# Patient Record
Sex: Male | Born: 1985
Health system: Southern US, Community
[De-identification: ages and names within clinical notes are randomized; demographics above are authoritative.]

## PROBLEM LIST (undated history)

## (undated) DIAGNOSIS — T7840XA Allergy, unspecified, initial encounter: Secondary | ICD-10-CM

## (undated) DIAGNOSIS — J45909 Unspecified asthma, uncomplicated: Secondary | ICD-10-CM

## (undated) HISTORY — DX: Allergy, unspecified, initial encounter: T78.40XA

## (undated) HISTORY — DX: Unspecified asthma, uncomplicated: J45.909

## (undated) HISTORY — PX: EYE SURGERY: SHX253

---

## 2017-02-25 ENCOUNTER — Emergency Department (INDEPENDENT_AMBULATORY_CARE_PROVIDER_SITE_OTHER)
Admission: EM | Admit: 2017-02-25 | Discharge: 2017-02-25 | Disposition: A | Discharge status: Home / Self Care | Payer: 59 | Source: Home / Self Care | Attending: Family Medicine | Admitting: Family Medicine

## 2017-02-25 ENCOUNTER — Encounter: Payer: Self-pay | Admitting: *Deleted

## 2017-02-25 DIAGNOSIS — T63441A Toxic effect of venom of bees, accidental (unintentional), initial encounter: Secondary | ICD-10-CM

## 2017-02-25 MED ORDER — ONDANSETRON HCL 4 MG PO TABS: 4.0000 mg | ORAL_TABLET | Freq: Four times a day (QID) | ORAL | 0 refills | Status: DC

## 2017-02-25 MED ORDER — FAMOTIDINE 40 MG PO TABS
40.0000 mg | ORAL_TABLET | Freq: Once | ORAL | Status: AC
  Administered 2017-02-25: 40 mg via ORAL

## 2017-02-25 MED ORDER — METHYLPREDNISOLONE SODIUM SUCC 125 MG IJ SOLR
125.0000 mg | Freq: Once | INTRAMUSCULAR | Status: AC
  Administered 2017-02-25: 125 mg via INTRAMUSCULAR

## 2017-02-25 MED ORDER — CETIRIZINE HCL 10 MG PO TABS: 10.0000 mg | ORAL_TABLET | Freq: Every day | ORAL | 1 refills | Status: DC

## 2017-02-25 MED ORDER — PREDNISONE 20 MG PO TABS: ORAL_TABLET | ORAL | 0 refills | Status: DC

## 2017-02-25 MED ORDER — ONDANSETRON 4 MG PO TBDP
4.0000 mg | ORAL_TABLET | Freq: Once | ORAL | Status: AC
  Administered 2017-02-25: 4 mg via ORAL

## 2017-02-25 NOTE — ED Provider Notes (Signed)
[facility]    CSN: [id number] Arrival date & time: 02/25/17  1718     History   Chief Complaint Chief Complaint  Patient presents with  . Insect Bite    HPI Samuel Spencer is a 31 y.o. male.   HPI  Sanad Fearnow is a 31 y.o. male presenting to UC with c/o multiple bee stings to Right hand and arm that occurred last night around 10PM.  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  He took benadryl  at 1300 but has had worsening pain and swelling in Right hand and generalized weakness with mild SOB.   He has been stung by bees before w/o significant reaction but he has never been stung this many times.  Denies oral swelling.    History reviewed. No pertinent past medical history.  There are no active problems to display for this patient.   History reviewed. No pertinent surgical history.     Home Medications    Prior to Admission medications   Medication Sig Start Date End Date Taking? Authorizing Provider  cetirizine (ZYRTEC) 10 MG tablet Take 1 tablet (10 mg total) by mouth daily. 02/25/17   Lurene Shadow, PA-C  ondansetron (ZOFRAN) 4 MG tablet Take 1 tablet (4 mg total) by mouth every 6 (six) hours. 02/25/17   Lurene Shadow, PA-C  predniSONE (DELTASONE) 20 MG tablet 3 tabs po day one, then 2 po daily x 4 days 02/25/17   Lurene Shadow, PA-C    Family History History reviewed. No pertinent family history.  Social History Social History  Substance Use Topics  . Smoking status: Never Smoker  . Smokeless tobacco: Never Used  . Alcohol use Yes     Allergies   Latex   Review of Systems Review of Systems  Respiratory: Positive for shortness of breath (mild). Negative for chest tightness, wheezing and stridor.   Cardiovascular: Negative for chest pain and palpitations.  Musculoskeletal: Positive for arthralgias, joint  swelling and myalgias.       Right hand  Skin: Positive for color change and rash.     Physical Exam Triage Vital Signs ED Triage Vitals [02/25/17 1744]  Enc Vitals Group     BP 123/80     Pulse Rate 77     Resp 16     Temp 98.9 F (37.2 C)     Temp Source Oral     SpO2 100 %     Weight 196 lb (88.9 kg)     Height  (1.88 m)     Head Circumference      Peak Flow      Pain Score      Pain Loc      Pain Edu?      Excl. in GC?    No data found.   Updated Vital Signs BP 123/80 (BP Location: Left Arm)   Pulse 77   Temp 98.9 F (37.2 C) (Oral)   Resp 16   Ht  (1.88 m)   Wt 196 lb (88.9 kg)   SpO2 100%   BMI 25.16 kg/m   Visual Acuity Right Eye Distance:   Left Eye Distance:   Bilateral Distance:    Right Eye Near:   Left Eye Near:    Bilateral Near:     Physical Exam  Constitutional: He is oriented to person, place, and time. He appears well-developed and well-nourished. No distress.  HENT:  Head: Normocephalic and atraumatic.  Mouth/Throat: Oropharynx is clear and moist.  No oral swelling  Eyes: EOM are normal.  Neck: Normal range of motion.  Cardiovascular: Normal rate.   Pulses:      Radial pulses are 2+ on the right side, and 2+ on the left side.  Pulmonary/Chest: Effort normal. No stridor.  Musculoskeletal: Normal range of motion. He exhibits edema and tenderness.  Right hand and wrist: moderate edema, tender. Slight decreased ROM due to swelling. Right forearm: mild edema to proximal aspect, mildly tender  Neurological: He is alert and oriented to person, place, and time.  Skin: Skin is warm and dry. Capillary refill takes less than 2 seconds. He is not diaphoretic. There is erythema.  Right hand, wrist, and forearm: diffuse erythema with slight warmth of skin.  Mildly tender. No induration or fluctuance. No bleeding or drainage. Rash does blanch.  Psychiatric: He has a normal mood and affect. His behavior is normal.  Nursing note and  vitals reviewed.    UC Treatments / Results  Labs (all labs ordered are listed, but only abnormal results are displayed) Labs Reviewed - No data to display  EKG  EKG Interpretation None       Radiology No results found.  Procedures Procedures (including critical care time)  Medications Ordered in UC Medications  famotidine (PEPCID) tablet 40 mg (40 mg Oral Given 02/25/17 1748)  methylPREDNISolone sodium succinate (SOLU-MEDROL) 125 mg/2 mL injection 125 mg (125 mg Intramuscular Given 02/25/17 1750)  ondansetron (ZOFRAN-ODT) disintegrating tablet 4 mg (4 mg Oral Given 02/25/17 1753)     Initial Impression / Assessment and Plan / UC Course  I have reviewed the triage vital signs and the nursing notes.  Pertinent labs & imaging results that were available during my care of the patient were reviewed by me and considered in my medical decision making (see chart for details).     Hx and exam c/w delayed systemic reaction to multiple bee stings.  No oral swelling or signs of distress at this time.  Pepcid and Solumedrol given in UC. Pt c/o mild nausea, zofran also given  Discharged home in good condition. Encouraged to start oral prednisone tomorrow along with cetirizine F/u with PCP as needed or go to closest ED if symptoms worsening.   Final Clinical Impressions(s) / UC Diagnoses   Final diagnoses:  Allergic reaction to bee sting    New Prescriptions Discharge Medication List as of 02/25/2017  6:01 PM    START taking these medications   Details  cetirizine (ZYRTEC) 10 MG tablet Take 1 tablet (10 mg total) by mouth daily., Starting Mon 02/25/2017, Print    ondansetron (ZOFRAN) 4 MG tablet Take 1 tablet (4 mg total) by mouth every 6 (six) hours., Starting Mon 02/25/2017, Print    predniSONE (DELTASONE) 20 MG tablet 3 tabs po day one, then 2 po daily x 4 days, Print         Controlled Substance Prescriptions [place] Controlled Substance Registry consulted? Not  Applicable   Rolla Plate 02/26/17 1421

## 2017-02-25 NOTE — ED Triage Notes (Signed)
Pt c/o RT hand swelling, weakness and SOB post bee stings last night at 10pm. He reports approximately 15 stings with 10 of them on his hand. He took Benadryl  at 1300.

## 2017-03-08 ENCOUNTER — Encounter: Payer: Self-pay | Admitting: Family Medicine

## 2017-03-08 ENCOUNTER — Ambulatory Visit (INDEPENDENT_AMBULATORY_CARE_PROVIDER_SITE_OTHER): Payer: 59 | Admitting: Family Medicine

## 2017-03-08 VITALS — BP 126/83 | HR 65 | Temp 97.8°F | Ht 74.0 in | Wt 199.2 lb

## 2017-03-08 DIAGNOSIS — M545 Low back pain, unspecified: Secondary | ICD-10-CM

## 2017-03-08 NOTE — Progress Notes (Signed)
Pre visit review using our clinic tool,if applicable. No additional management support is needed unless otherwise documented below in the visit note.  

## 2017-03-08 NOTE — Patient Instructions (Signed)
Heat (pad or rice pillow in microwave) over affected area, 10-15 minutes every 2-3 hours while awake.   Ibuprofen 400-600 mg (2-3 over the counter strength tabs) every 6 hours as needed for pain.  OK to take Tylenol 1000 mg (2 extra strength tabs) or 975 mg (3 regular strength tabs) every 6 hours as needed.  EXERCISES  RANGE OF MOTION (ROM) AND STRETCHING EXERCISES - Low Back Sprain Most people with lower back pain will find that their symptoms get worse with excessive bending forward (flexion) or arching at the lower back (extension). The exercises that will help resolve your symptoms will focus on the opposite motion.  Your physician, physical therapist or athletic trainer will help you determine which exercises will be most helpful to resolve your lower back pain. Do not complete any exercises without first consulting with your caregiver. Discontinue any exercises which make your symptoms worse, until you speak to your caregiver. If you have pain, numbness or tingling which travels down into your buttocks, leg or foot, the goal of the therapy is for these symptoms to move closer to your back and eventually resolve. Sometimes, these leg symptoms will get better, but your lower back pain may worsen. This is often an indication of progress in your rehabilitation. Be very alert to any changes in your symptoms and the activities in which you participated in the 24 hours prior to the change. Sharing this information with your caregiver will allow him or her to most efficiently treat your condition. These exercises may help you when beginning to rehabilitate your injury. Your symptoms may resolve with or without further involvement from your physician, physical therapist or athletic trainer. While completing these exercises, remember:   Restoring tissue flexibility helps normal motion to return to the joints. This allows healthier, less painful movement and activity.  An effective stretch should be held  for at least 30 seconds.  A stretch should never be painful. You should only feel a gentle lengthening or release in the stretched tissue. FLEXION RANGE OF MOTION AND STRETCHING EXERCISES:  STRETCH - Flexion, Single Knee to Chest   Lie on a firm bed or floor with both legs extended in front of you.  Keeping one leg in contact with the floor, bring your opposite knee to your chest. Hold your leg in place by either grabbing behind your thigh or at your knee.  Pull until you feel a gentle stretch in your low back. Hold 15-20 seconds.  Slowly release your grasp and repeat the exercise with the opposite side. Repeat 2 times. Complete this exercise 1-2 times per day.   STRETCH - Flexion, Double Knee to Chest  Lie on a firm bed or floor with both legs extended in front of you.  Keeping one leg in contact with the floor, bring your opposite knee to your chest.  Tense your stomach muscles to support your back and then lift your other knee to your chest. Hold your legs in place by either grabbing behind your thighs or at your knees.  Pull both knees toward your chest until you feel a gentle stretch in your low back. Hold 15-20 seconds.  Tense your stomach muscles and slowly return one leg at a time to the floor. Repeat 2 times. Complete this exercise 1-2 times per day.   STRETCH - Low Trunk Rotation  Lie on a firm bed or floor. Keeping your legs in front of you, bend your knees so they are both pointed toward the ceiling  and your feet are flat on the floor.  Extend your arms out to the side. This will stabilize your upper body by keeping your shoulders in contact with the floor.  Gently and slowly drop both knees together to one side until you feel a gentle stretch in your low back. Hold for 15-20 seconds.  Tense your stomach muscles to support your lower back as you bring your knees back to the starting position. Repeat the exercise to the other side. Repeat 2 times. Complete this  exercise 1-2 times per day  EXTENSION RANGE OF MOTION AND FLEXIBILITY EXERCISES:  STRETCH - Extension, Prone on Elbows   Lie on your stomach on the floor, a bed will be too soft. Place your palms about shoulder width apart and at the height of your head.  Place your elbows under your shoulders. If this is too painful, stack pillows under your chest.  Allow your body to relax so that your hips drop lower and make contact more completely with the floor.  Hold this position for 15-20 seconds.  Slowly return to lying flat on the floor. Repeat 2 times. Complete this exercise 1-2 times per day.   RANGE OF MOTION - Extension, Prone Press Ups  Lie on your stomach on the floor, a bed will be too soft. Place your palms about shoulder width apart and at the height of your head.  Keeping your back as relaxed as possible, slowly straighten your elbows while keeping your hips on the floor. You may adjust the placement of your hands to maximize your comfort. As you gain motion, your hands will come more underneath your shoulders.  Hold this position 15-20 seconds.  Slowly return to lying flat on the floor. Repeat 2 times. Complete this exercise 1-2 times per day.   RANGE OF MOTION- Quadruped, Neutral Spine   Assume a hands and knees position on a firm surface. Keep your hands under your shoulders and your knees under your hips. You may place padding under your knees for comfort.  Drop your head and point your tailbone toward the ground below you. This will round out your lower back like an angry cat. Hold this position for 15-20 seconds.  Slowly lift your head and release your tail bone so that your back sags into a large arch, like an old horse.  Hold this position for 15-20 seconds.  Repeat this until you feel limber in your low back.  Now, find your "sweet spot." This will be the most comfortable position somewhere between the two previous positions. This is your neutral spine. Once you  have found this position, tense your stomach muscles to support your low back.  Hold this position for 15-20 seconds. Repeat 2 times. Complete this exercise 1-2 times per day.  STRENGTHENING EXERCISES - Low Back Sprain These exercises may help you when beginning to rehabilitate your injury. These exercises should be done near your "sweet spot." This is the neutral, low-back arch, somewhere between fully rounded and fully arched, that is your least painful position. When performed in this safe range of motion, these exercises can be used for people who have either a flexion or extension based injury. These exercises may resolve your symptoms with or without further involvement from your physician, physical therapist or athletic trainer. While completing these exercises, remember:   Muscles can gain both the endurance and the strength needed for everyday activities through controlled exercises.  Complete these exercises as instructed by your physician, physical therapist or  Event organiser. Increase the resistance and repetitions only as guided.  You may experience muscle soreness or fatigue, but the pain or discomfort you are trying to eliminate should never worsen during these exercises. If this pain does worsen, stop and make certain you are following the directions exactly. If the pain is still present after adjustments, discontinue the exercise until you can discuss the trouble with your caregiver.  STRENGTHENING - Deep Abdominals, Pelvic Tilt   Lie on a firm bed or floor. Keeping your legs in front of you, bend your knees so they are both pointed toward the ceiling and your feet are flat on the floor.  Tense your lower abdominal muscles to press your low back into the floor. This motion will rotate your pelvis so that your tail bone is scooping upwards rather than pointing at your feet or into the floor. With a gentle tension and even breathing, hold this position for 10-15 seconds. Repeat 2  times. Complete this exercise 1 time per day.   STRENGTHENING - Abdominals, Crunches   Lie on a firm bed or floor. Keeping your legs in front of you, bend your knees so they are both pointed toward the ceiling and your feet are flat on the floor. Cross your arms over your chest.  Slightly tip your chin down without bending your neck.  Tense your abdominals and slowly lift your trunk high enough to just clear your shoulder blades. Lifting higher can put excessive stress on the lower back and does not further strengthen your abdominal muscles.  Control your return to the starting position. Repeat 2 times. Complete this exercise once every 1-2 days.   STRENGTHENING - Quadruped, Opposite UE/LE Lift   Assume a hands and knees position on a firm surface. Keep your hands under your shoulders and your knees under your hips. You may place padding under your knees for comfort.  Find your neutral spine and gently tense your abdominal muscles so that you can maintain this position. Your shoulders and hips should form a rectangle that is parallel with the floor and is not twisted.  Keeping your trunk steady, lift your right hand no higher than your shoulder and then your left leg no higher than your hip. Make sure you are not holding your breath. Hold this position for 15-20 seconds.  Continuing to keep your abdominal muscles tense and your back steady, slowly return to your starting position. Repeat with the opposite arm and leg. Repeat 2 times. Complete this exercise once every 1-2 days.   STRENGTHENING - Abdominals and Quadriceps, Straight Leg Raise   Lie on a firm bed or floor with both legs extended in front of you.  Keeping one leg in contact with the floor, bend the other knee so that your foot can rest flat on the floor.  Find your neutral spine, and tense your abdominal muscles to maintain your spinal position throughout the exercise.  Slowly lift your straight leg off the floor about 6  inches for a count of 15, making sure to not hold your breath.  Still keeping your neutral spine, slowly lower your leg all the way to the floor. Repeat this exercise with each leg 2 times. Complete this exercise once every 1-2 days. POSTURE AND BODY MECHANICS CONSIDERATIONS - Low Back Sprain Keeping correct posture when sitting, standing or completing your activities will reduce the stress put on different body tissues, allowing injured tissues a chance to heal and limiting painful experiences. The following are general  guidelines for improved posture. Your physician or physical therapist will provide you with any instructions specific to your needs. While reading these guidelines, remember:  The exercises prescribed by your provider will help you have the flexibility and strength to maintain correct postures.  The correct posture provides the best environment for your joints to work. All of your joints have less wear and tear when properly supported by a spine with good posture. This means you will experience a healthier, less painful body.  Correct posture must be practiced with all of your activities, especially prolonged sitting and standing. Correct posture is as important when doing repetitive low-stress activities (typing) as it is when doing a single heavy-load activity (lifting).  RESTING POSITIONS Consider which positions are most painful for you when choosing a resting position. If you have pain with flexion-based activities (sitting, bending, stooping, squatting), choose a position that allows you to rest in a less flexed posture. You would want to avoid curling into a fetal position on your side. If your pain worsens with extension-based activities (prolonged standing, working overhead), avoid resting in an extended position such as sleeping on your stomach. Most people will find more comfort when they rest with their spine in a more neutral position, neither too rounded nor too arched.  Lying on a non-sagging bed on your side with a pillow between your knees, or on your back with a pillow under your knees will often provide some relief. Keep in mind, being in any one position for a prolonged period of time, no matter how correct your posture, can still lead to stiffness. PROPER SITTING POSTURE In order to minimize stress and discomfort on your spine, you must sit with correct posture. Sitting with good posture should be effortless for a healthy body. Returning to good posture is a gradual process. Many people can work toward this most comfortably by using various supports until they have the flexibility and strength to maintain this posture on their own. When sitting with proper posture, your ears will fall over your shoulders and your shoulders will fall over your hips. You should use the back of the chair to support your upper back. Your lower back will be in a neutral position, just slightly arched. You may place a small pillow or folded towel at the base of your lower back for  support.  When working at a desk, create an environment that supports good, upright posture. Without extra support, muscles tire, which leads to excessive strain on joints and other tissues. Keep these recommendations in mind:  CHAIR:  A chair should be able to slide under your desk when your back makes contact with the back of the chair. This allows you to work closely.  The chair's height should allow your eyes to be level with the upper part of your monitor and your hands to be slightly lower than your elbows.  BODY POSITION  Your feet should make contact with the floor. If this is not possible, use a foot rest.  Keep your ears over your shoulders. This will reduce stress on your neck and low back.  INCORRECT SITTING POSTURES  If you are feeling tired and unable to assume a healthy sitting posture, do not slouch or slump. This puts excessive strain on your back tissues, causing more damage and  pain. Healthier options include:  Using more support, like a lumbar pillow.  Switching tasks to something that requires you to be upright or walking.  Talking a brief walk.  Lying down to rest in a neutral-spine position.  PROLONGED STANDING WHILE SLIGHTLY LEANING FORWARD  When completing a task that requires you to lean forward while standing in one place for a long time, place either foot up on a stationary 2-4 inch high object to help maintain the best posture. When both feet are on the ground, the lower back tends to lose its slight inward curve. If this curve flattens (or becomes too large), then the back and your other joints will experience too much stress, tire more quickly, and can cause pain.  CORRECT STANDING POSTURES Proper standing posture should be assumed with all daily activities, even if they only take a few moments, like when brushing your teeth. As in sitting, your ears should fall over your shoulders and your shoulders should fall over your hips. You should keep a slight tension in your abdominal muscles to brace your spine. Your tailbone should point down to the ground, not behind your body, resulting in an over-extended swayback posture.   INCORRECT STANDING POSTURES  Common incorrect standing postures include a forward head, locked knees and/or an excessive swayback. WALKING Walk with an upright posture. Your ears, shoulders and hips should all line-up.  PROLONGED ACTIVITY IN A FLEXED POSITION When completing a task that requires you to bend forward at your waist or lean over a low surface, try to find a way to stabilize 3 out of 4 of your limbs. You can place a hand or elbow on your thigh or rest a knee on the surface you are reaching across. This will provide you more stability, so that your muscles do not tire as quickly. By keeping your knees relaxed, or slightly bent, you will also reduce stress across your lower back. CORRECT LIFTING TECHNIQUES  DO :  Assume a  wide stance. This will provide you more stability and the opportunity to get as close as possible to the object which you are lifting.  Tense your abdominals to brace your spine. Bend at the knees and hips. Keeping your back locked in a neutral-spine position, lift using your leg muscles. Lift with your legs, keeping your back straight.  Test the weight of unknown objects before attempting to lift them.  Try to keep your elbows locked down at your sides in order get the best strength from your shoulders when carrying an object.     Always ask for help when lifting heavy or awkward objects. INCORRECT LIFTING TECHNIQUES DO NOT:   Lock your knees when lifting, even if it is a small object.  Bend and twist. Pivot at your feet or move your feet when needing to change directions.  Assume that you can safely pick up even a paperclip without proper posture.

## 2017-03-08 NOTE — Progress Notes (Signed)
Chief Complaint  Patient presents with  . Establish Care       New Patient Visit SUBJECTIVE: HPI: Samuel Spencer is an 31 y.o.male who is being seen for establishing care.  The patient has not had PCP in many years.   Over the past 1 month, the patient is been having left-sided lower back pain. It comes and goes. No injury or change in activity. He describes as more of an ache. No radiation. He is concerned he may have a kidney stone. He has no personal or family history of kidney stones. He does not have history of gout. He maintains his oral intake of water is good. The patient denies any numbness, tingling, weakness, blood in the urine, nausea, vomiting, or incontinence. The patient has been using ibuprofen with relief. He does not routinely stretch.  Allergies  Allergen Reactions  . Latex     Past Medical History:  Diagnosis Date  . Allergy   . Asthma    Past Surgical History:  Procedure Laterality Date  . EYE SURGERY     Social History   Social History  . Marital status: Married   Social History Main Topics  . Smoking status: Never Smoker  . Smokeless tobacco: Never Used  . Alcohol use Yes  . Drug use: No   Family History  Problem Relation Age of Onset  . Arthritis Mother   . Drug abuse Father   . Alcohol abuse Father   . Cancer Neg Hx    Takes no medications routinely.   ROS MSK: +back pain  Neuro: Denies numbness/tingling   OBJECTIVE: BP 126/83   Pulse 65   Temp 97.8 F (36.6 C) (Oral)   Ht 6\' 2"  (1.88 m)   Wt 199 lb 3.2 oz (90.4 kg)   SpO2 100%   BMI 25.58 kg/m   Constitutional: -  VS reviewed -  Well developed, well nourished, appears stated age -  No apparent distress  Psychiatric: -  Oriented to person, place, and time -  Memory intact -  Affect and mood normal -  Fluent conversation, good eye contact -  Judgment and insight age appropriate  Eye: -  Conjunctivae clear, no discharge -  Pupils symmetric, round, reactive to  light  Cardiovascular: -  RRR -  No LE edema  Respiratory: -  Normal respiratory effort, no accessory muscle use, no retraction -  Breath sounds equal, no wheezes, no ronchi, no crackles  Gastrointestinal: -  Bowel sounds normal -  No tenderness, no distention, no guarding, no masses  Neurological:  -  CN II - XII grossly intact -  Sensation grossly intact to light touch, equal bilaterally -  DTR's equal and symmetric in LE's b/l  Musculoskeletal: -  No clubbing, no cyanosis -  Gait normal -  +TTP over L erector spinae msc -   Neg Lloyd's sign  Skin: -  No significant lesion on inspection -  Warm and dry to palpation   ASSESSMENT/PLAN: Acute left-sided low back pain without sciatica  NSAIDs, stretches/exercises, Tylenol, heat. No concerning findings for kidney stone. Patient should return in 1 mo for CPE, fasting or prn. The patient voiced understanding and agreement to the plan.   Jilda Rocheicholas Paul Jacksboro[place]end[place], DO 03/08/17  9:03 AM

## 2017-09-26 ENCOUNTER — Ambulatory Visit (INDEPENDENT_AMBULATORY_CARE_PROVIDER_SITE_OTHER): Payer: Self-pay | Admitting: Nurse Practitioner

## 2017-09-26 DIAGNOSIS — Z111 Encounter for screening for respiratory tuberculosis: Secondary | ICD-10-CM

## 2017-09-26 NOTE — Progress Notes (Signed)
Patient presents for PPD placement for School denies previous positive TB test  Denies known exposure to TB   Tuberculin skin test applied to left ventral forearm.  Patient informed to return to [facility] in 48-72 hours for PPD read. Vaccine Information Statement provided to patient.

## 2017-09-28 NOTE — Progress Notes (Signed)
Came in for PPD reading today. Read by me as negative-0 mm  negative

## 2017-09-30 ENCOUNTER — Telehealth: Payer: Self-pay

## 2017-09-30 NOTE — Telephone Encounter (Signed)
Mailbox of the patient is full and cannot accept any message. 

## 2017-10-01 ENCOUNTER — Ambulatory Visit (INDEPENDENT_AMBULATORY_CARE_PROVIDER_SITE_OTHER): Payer: No Typology Code available for payment source | Admitting: Family Medicine

## 2017-10-01 ENCOUNTER — Encounter: Payer: Self-pay | Admitting: Family Medicine

## 2017-10-01 VITALS — BP 118/80 | HR 73 | Temp 98.2°F | Ht 74.0 in | Wt 199.1 lb

## 2017-10-01 DIAGNOSIS — Z0000 Encounter for general adult medical examination without abnormal findings: Secondary | ICD-10-CM

## 2017-10-01 DIAGNOSIS — Z114 Encounter for screening for human immunodeficiency virus [HIV]: Secondary | ICD-10-CM

## 2017-10-01 LAB — COMPREHENSIVE METABOLIC PANEL
ALT: 24 U/L (ref 0–53)
AST: 16 U/L (ref 0–37)
Albumin: 4.5 g/dL (ref 3.5–5.2)
Alkaline Phosphatase: 65 U/L (ref 39–117)
BUN: 10 mg/dL (ref 6–23)
CALCIUM: 9.5 mg/dL (ref 8.4–10.5)
CHLORIDE: 104 meq/L (ref 96–112)
CO2: 28 meq/L (ref 19–32)
CREATININE: 0.92 mg/dL (ref 0.40–1.50)
GFR: 100.49 mL/min (ref >60.00)
Glucose, Bld: 99 mg/dL (ref 70–99)
Potassium: 4.2 mEq/L (ref 3.5–5.1)
SODIUM: 139 meq/L (ref 135–145)
Total Bilirubin: 0.9 mg/dL (ref 0.2–1.2)
Total Protein: 7.5 g/dL (ref 6.0–8.3)

## 2017-10-01 LAB — LIPID PANEL
CHOL/HDL RATIO: 4
Cholesterol: 144 mg/dL (ref 0–200)
HDL: 40.8 mg/dL (ref >39.00)
LDL CALC: 88 mg/dL (ref 0–99)
NonHDL: 103.36
TRIGLYCERIDES: 78 mg/dL (ref 0.0–149.0)
VLDL: 15.6 mg/dL (ref 0.0–40.0)

## 2017-10-01 NOTE — Patient Instructions (Signed)
Keep up the good work.  Let me know over MyChart if you're interested in seeing a urologist for vasectomy.  Let us know if you need anything.

## 2017-10-01 NOTE — Progress Notes (Signed)
Chief Complaint  Patient presents with  . Annual Exam    Well Male Samuel Spencer is here for a complete physical.   His last physical was >1 year ago.  Current diet: in general, a "healthy" diet.   Current exercise: Chases small child around Weight trend: stable Does pt snore? No. Daytime fatigue? Yes Seat belt? Yes.    Health maintenance Tetanus- Yes HIV- No  Past Medical History:  Diagnosis Date  . Allergy   . Asthma    Allergy induced     Past Surgical History:  Procedure Laterality Date  . EYE SURGERY      Medications  Albuterol prn.    Allergies Allergies  Allergen Reactions  . Latex     Family History Family History  Problem Relation Age of Onset  . Arthritis Mother   . Drug abuse Father   . Alcohol abuse Father   . Cancer Neg Hx     Review of Systems: Constitutional: no fevers or chills Eye:  no recent significant change in vision Ear/Nose/Mouth/Throat:  Ears:  no tinnitus or hearing loss Nose/Mouth/Throat:  no complaints of nasal congestion, no sore throat Cardiovascular:  no chest pain, no palpitations Respiratory:  no cough and no shortness of breath Gastrointestinal:  no abdominal pain, no change in bowel habits GU:  Male: negative for dysuria, frequency, and incontinence and negative for prostate symptoms Musculoskeletal/Extremities:  no pain, redness, or swelling of the joints Integumentary (Skin/Breast):  no abnormal skin lesions reported Neurologic:  no headaches, no numbness, tingling Endocrine: No unexpected weight changes Hematologic/Lymphatic:  no night sweats  Exam BP 118/80 (BP Location: Left Arm, Patient Position: Sitting, Cuff Size: Normal)   Pulse 73   Temp 98.2 F (36.8 C) (Oral)   Ht  (1.88 m)   Wt 199 lb 2 oz (90.3 kg)   SpO2 97%   BMI 25.57 kg/m  General:  well developed, well nourished, in no apparent distress Skin:  no significant moles, warts, or growths Head:  no masses, lesions, or  tenderness Eyes:  pupils equal and round, sclera anicteric without injection Ears:  canals without lesions, TMs shiny without retraction, no obvious effusion, no erythema Nose:  nares patent, septum midline, mucosa normal Throat/Pharynx:  lips and gingiva without lesion; tongue and uvula midline; non-inflamed pharynx; no exudates or postnasal drainage Neck: neck supple without adenopathy, thyromegaly, or masses Lungs:  clear to auscultation, breath sounds equal bilaterally, no respiratory distress Cardio:  regular rate and rhythm, no bruits, no LE edema Abdomen:  abdomen soft, nontender; bowel sounds normal; no masses or organomegaly Genital (male): circumcised penis, no lesions or discharge; testes present bilaterally without masses or tenderness Rectal: Deferred Musculoskeletal:  symmetrical muscle groups noted without atrophy or deformity Extremities:  no clubbing, cyanosis, or edema, no deformities, no skin discoloration Neuro:  gait normal; deep tendon reflexes normal and symmetric Psych: well oriented with normal range of affect and appropriate judgment/insight  Assessment and Plan  Well adult exam - Plan: Comprehensive metabolic panel, Lipid panel  Screening for HIV (human immunodeficiency virus) - Plan: HIV antibody   Well 32 y.o. male. Counseled on diet and exercise. He is doing well. Discussed PCV23, as he rarely has issues, will hold off for now.  He is interested in vasectomy at some point, he will let us know when he is ready and we will refer to urology. Other orders as above. Follow up in 1 year pending the above workup. The patient voiced understanding  and agreement to the plan.  Jilda Roche [place], DO 10/01/17 8:23 AM

## 2017-10-01 NOTE — Progress Notes (Signed)
Pre visit review using our clinic review tool, if applicable. No additional management support is needed unless otherwise documented below in the visit note. 

## 2017-10-02 LAB — HIV ANTIBODY (ROUTINE TESTING W REFLEX): HIV 1&2 Ab, 4th Generation: NONREACTIVE

## 2017-11-28 ENCOUNTER — Ambulatory Visit (INDEPENDENT_AMBULATORY_CARE_PROVIDER_SITE_OTHER): Payer: Self-pay | Admitting: Family Medicine

## 2017-11-28 VITALS — BP 110/72 | HR 75 | Temp 98.6°F | Wt 200.8 lb

## 2017-11-28 DIAGNOSIS — L03115 Cellulitis of right lower limb: Secondary | ICD-10-CM

## 2017-11-28 MED ORDER — CEFTRIAXONE SODIUM 500 MG IJ SOLR
500.0000 mg | Freq: Once | INTRAMUSCULAR | Status: AC
  Administered 2017-11-28: 500 mg via INTRAMUSCULAR

## 2017-11-28 MED ORDER — CEPHALEXIN 500 MG PO CAPS: 500.0000 mg | ORAL_CAPSULE | Freq: Three times a day (TID) | ORAL | 0 refills | Status: AC

## 2017-11-28 MED FILL — CEPHALEXIN 500 MG CAPSULE: 500 | 10 days supply | Qty: 30 | Fill #0

## 2017-11-28 NOTE — Progress Notes (Signed)
Patient ID: Samuel Spencer, male    DOB: May 25, 1985, 32 y.o.   MRN: [id number]  PCP: Sharlene Dory, DO  Chief Complaint  Patient presents with  . right foot pain, swollen    Subjective:  HPI Samuel Spencer is a 32 y.o. male presents for evaluation of right ankle swelling after sustaining an abrasion while white water rafting 5 days prior. Over the course of the last 5 days, patient's ankle has increased in swelling, tender to touch, and redden. He has only attempted relief with ibuprofen. Denies fever, chills, red streaks, redness extending beyond the site of wound, or N&V. Social History   Socioeconomic History  . Marital status: Married    Spouse name: Not on file  . Number of children: Not on file  . Years of education: Not on file  . Highest education level: Not on file  Occupational History  . Not on file  Social Needs  . Financial resource strain: Not on file  . Food insecurity:    Worry: Not on file    Inability: Not on file  . Transportation needs:    Medical: Not on file    Non-medical: Not on file  Tobacco Use  . Smoking status: Never Smoker  . Smokeless tobacco: Never Used  Substance and Sexual Activity  . Alcohol use: Yes  . Drug use: No  . Sexual activity: Yes    Partners: Female  Lifestyle  . Physical activity:    Days per week: Not on file    Minutes per session: Not on file  . Stress: Not on file  Relationships  . Social connections:    Talks on phone: Not on file    Gets together: Not on file    Attends religious service: Not on file    Active member of club or organization: Not on file    Attends meetings of clubs or organizations: Not on file    Relationship status: Not on file  . Intimate partner violence:    Fear of current or ex partner: Not on file    Emotionally abused: Not on file    Physically abused: Not on file    Forced sexual activity: Not on file  Other Topics Concern  . Not on file  Social History  Narrative  . Not on file    Family History  Problem Relation Age of Onset  . Arthritis Mother   . Drug abuse Father   . Alcohol abuse Father   . Cancer Neg Hx    Review of Systems Pertinent negatives listed in HPI Patient Active Problem List   Diagnosis Date Noted  . Well adult exam 10/01/2017    Allergies  Allergen Reactions  . Latex     Prior to Admission medications   Medication Sig Start Date End Date Taking? Authorizing Provider  albuterol (VENTOLIN HFA) 108 (90 Base) MCG/ACT inhaler Inhale 1-2 puffs into the lungs every 6 (six) hours as needed for wheezing or shortness of breath. 10/01/17   Wendling, Jilda Roche, DO  cephALEXin (KEFLEX) 500 MG capsule Take 1 capsule (500 mg total) by mouth 3 (three) times daily for 10 days. 11/28/17 12/08/17  Bing Neighbors, FNP    Past Medical, Surgical Family and Social History reviewed and updated.    Objective:   Today's Vitals   11/28/17 1518  BP: 110/72  Pulse: 75  Temp: 98.6 F (37 C)  TempSrc: Oral  SpO2: 97%  Weight: 200 lb 12.8 oz (  91.1 kg)    Wt Readings from Last 3 Encounters:  11/28/17 200 lb 12.8 oz (91.1 kg)  10/01/17 199 lb 2 oz (90.3 kg)  03/08/17 199 lb 3.2 oz (90.4 kg)   Physical Exam  Constitutional: He is oriented to person, place, and time. He appears well-developed and well-nourished.  Cardiovascular: Normal rate, regular rhythm and normal heart sounds.  Pulmonary/Chest: Effort normal and breath sounds normal.  Neurological: He is alert and oriented to person, place, and time.  Skin: Skin is warm. Lesion noted. There is erythema.     Psychiatric: He has a normal mood and affect. His behavior is normal. Judgment and thought content normal.  Vitals reviewed.  Assessment & Plan:  1. Cellulitis of right lower extremity Administered cefTRIAXone (ROCEPHIN) injection 500 mg IM. Treat empirically with Keflex 500 mg TID x 10 days. Educated provided regarding worsening signs of infection. Patient  advised to follow-up in 72 hours if wound and swelling doesn't improve.   Meds ordered this encounter  Medications  . cefTRIAXone (ROCEPHIN) injection 500 mg  . cephALEXin (KEFLEX) 500 MG capsule    Sig: Take 1 capsule (500 mg total) by mouth 3 (three) times daily for 10 days.    Dispense:  30 capsule    Refill:  0   If symptoms worsen or do not improve, return for follow-up, follow-up with PCP, or at the emergency department if severity of symptoms warrant a higher level of care.   Godfrey PickKimberly S. Tiburcio PeaHarris, MSN, FNP-C [facility]  [address]  [place], [place] [id number] [phone]

## 2017-11-28 NOTE — Patient Instructions (Signed)
Take all medication as prescribed. I recommend wearing compression stockings to help with swelling while standing for prolonged periods of time. I recommend applying ice application to help manage swelling.    Cellulitis, Adult Cellulitis is a skin infection. The infected area is usually red and sore. This condition occurs most often in the arms and lower legs. It is very important to get treated for this condition. Follow these instructions at home:  Take over-the-counter and prescription medicines only as told by your doctor.  If you were prescribed an antibiotic medicine, take it as told by your doctor. Do not stop taking the antibiotic even if you start to feel better.  Drink enough fluid to keep your pee (urine) clear or pale yellow.  Do not touch or rub the infected area.  Raise (elevate) the infected area above the level of your heart while you are sitting or lying down.  Place warm or cold wet cloths (warm or cold compresses) on the infected area. Do this as told by your doctor.  Keep all follow-up visits as told by your doctor. This is important. These visits let your doctor make sure your infection is not getting worse. Contact a doctor if:  You have a fever.  Your symptoms do not get better after 1-2 days of treatment.  Your bone or joint under the infected area starts to hurt after the skin has healed.  Your infection comes back. This can happen in the same area or another area.  You have a swollen bump in the infected area.  You have new symptoms.  You feel ill and also have muscle aches and pains. Get help right away if:  Your symptoms get worse.  You feel very sleepy.  You throw up (vomit) or have watery poop (diarrhea) for a long time.  There are red streaks coming from the infected area.  Your red area gets larger.  Your red area turns darker. This information is not intended to replace advice given to you by your health care provider. Make sure you  discuss any questions you have with your health care provider. Document Released: 10/24/2007 Document Revised: 10/13/2015 Document Reviewed: 03/16/2015 Elsevier Interactive Patient Education  2018 [employer]

## 2017-12-12 ENCOUNTER — Ambulatory Visit (INDEPENDENT_AMBULATORY_CARE_PROVIDER_SITE_OTHER): Payer: Self-pay | Admitting: Family Medicine

## 2017-12-12 VITALS — BP 115/75 | HR 61 | Temp 97.6°F | Resp 97 | Wt 205.0 lb

## 2017-12-12 DIAGNOSIS — S139XXA Sprain of joints and ligaments of unspecified parts of neck, initial encounter: Secondary | ICD-10-CM

## 2017-12-12 MED ORDER — CYCLOBENZAPRINE HCL 10 MG PO TABS: 10.0000 mg | ORAL_TABLET | Freq: Three times a day (TID) | ORAL | 0 refills | Status: DC | PRN

## 2017-12-12 MED ORDER — MELOXICAM 15 MG PO TABS: 15.0000 mg | ORAL_TABLET | Freq: Every day | ORAL | 0 refills | Status: DC

## 2017-12-12 MED FILL — CYCLOBENZAPRINE HCL 10 MG T: 10 | 5 days supply | Qty: 15 | Fill #0

## 2017-12-12 MED FILL — MELOXICAM 15 MG TABLET: 15 | 7 days supply | Qty: 7 | Fill #0

## 2017-12-12 NOTE — Patient Instructions (Signed)
Cervical Strain and Sprain Rehab Ask your health care provider which exercises are safe for you. Do exercises exactly as told by your health care provider and adjust them as directed. It is normal to feel mild stretching, pulling, tightness, or discomfort as you do these exercises, but you should stop right away if you feel sudden pain or your pain gets worse.Do not begin these exercises until told by your health care provider. Stretching and range of motion exercises These exercises warm up your muscles and joints and improve the movement and flexibility of your neck. These exercises also help to relieve pain, numbness, and tingling. Exercise A: Cervical side bend  1. Using good posture, sit on a stable chair or stand up. 2. Without moving your shoulders, slowly tilt your left / right ear to your shoulder until you feel a stretch in your neck muscles. You should be looking straight ahead. 3. Hold for __________ seconds. 4. Repeat with the other side of your neck. Repeat __________ times. Complete this exercise __________ times a day. Exercise B: Cervical rotation  1. Using good posture, sit on a stable chair or stand up. 2. Slowly turn your head to the side as if you are looking over your left / right shoulder. ? Keep your eyes level with the ground. ? Stop when you feel a stretch along the side and the back of your neck. 3. Hold for __________ seconds. 4. Repeat this by turning to your other side. Repeat __________ times. Complete this exercise __________ times a day. Exercise C: Thoracic extension and pectoral stretch 1. Roll a towel or a small blanket so it is about 4 inches (10 cm) in diameter. 2. Lie down on your back on a firm surface. 3. Put the towel lengthwise, under your spine in the middle of your back. It should not be not under your shoulder blades. The towel should line up with your spine from your middle back to your lower back. 4. Put your hands behind your head and let your  elbows fall out to your sides. 5. Hold for __________ seconds. Repeat __________ times. Complete this exercise __________ times a day. Strengthening exercises These exercises build strength and endurance in your neck. Endurance is the ability to use your muscles for a long time, even after your muscles get tired. Exercise D: Upper cervical flexion, isometric 1. Lie on your back with a thin pillow behind your head and a small rolled-up towel under your neck. 2. Gently tuck your chin toward your chest and nod your head down to look toward your feet. Do not lift your head off the pillow. 3. Hold for __________ seconds. 4. Release the tension slowly. Relax your neck muscles completely before you repeat this exercise. Repeat __________ times. Complete this exercise __________ times a day. Exercise E: Cervical extension, isometric  1. Stand about 6 inches (15 cm) away from a wall, with your back facing the wall. 2. Place a soft object, about 6-8 inches (15-20 cm) in diameter, between the back of your head and the wall. A soft object could be a small pillow, a ball, or a folded towel. 3. Gently tilt your head back and press into the soft object. Keep your jaw and forehead relaxed. 4. Hold for __________ seconds. 5. Release the tension slowly. Relax your neck muscles completely before you repeat this exercise. Repeat __________ times. Complete this exercise __________ times a day. Posture and body mechanics  Body mechanics refers to the movements and positions of   your body while you do your daily activities. Posture is part of body mechanics. Good posture and healthy body mechanics can help to relieve stress in your body's tissues and joints. Good posture means that your spine is in its natural S-curve position (your spine is neutral), your shoulders are pulled back slightly, and your head is not tipped forward. The following are general guidelines for applying improved posture and body mechanics to  your everyday activities. Standing  When standing, keep your spine neutral and keep your feet about hip-width apart. Keep a slight bend in your knees. Your ears, shoulders, and hips should line up.  When you do a task in which you stand in one place for a long time, place one foot up on a stable object that is 2-4 inches (5-10 cm) high, such as a footstool. This helps keep your spine neutral. Sitting   When sitting, keep your spine neutral and your keep feet flat on the floor. Use a footrest, if necessary, and keep your thighs parallel to the floor. Avoid rounding your shoulders, and avoid tilting your head forward.  When working at a desk or a computer, keep your desk at a height where your hands are slightly lower than your elbows. Slide your chair under your desk so you are close enough to maintain good posture.  When working at a computer, place your monitor at a height where you are looking straight ahead and you do not have to tilt your head forward or downward to look at the screen. Resting When lying down and resting, avoid positions that are most painful for you. Try to support your neck in a neutral position. You can use a contour pillow or a small rolled-up towel. Your pillow should support your neck but not push on it. This information is not intended to replace advice given to you by your health care provider. Make sure you discuss any questions you have with your health care provider. Document Released: 05/07/2005 Document Revised: 01/12/2016 Document Reviewed: 04/13/2015 Elsevier Interactive Patient Education  2018 [employer]vier Inc.  

## 2017-12-12 NOTE — Progress Notes (Signed)
Samuel Spencer is a 32 y.o. male who presents today with concerns of left sided neck pain x 2 weeks. He denies trauma or injury but does report getting a new bed in the last month but no change in pillow or sleeping positions.  Review of Systems  Constitutional: Negative for chills, fever and malaise/fatigue.  HENT: Negative for congestion, ear discharge, ear pain, sinus pain and sore throat.   Eyes: Negative.   Respiratory: Negative for cough, sputum production and shortness of breath.   Cardiovascular: Negative.  Negative for chest pain.  Gastrointestinal: Negative for abdominal pain, diarrhea, nausea and vomiting.  Genitourinary: Negative for dysuria, frequency, hematuria and urgency.  Musculoskeletal: Positive for neck pain. Negative for myalgias.  Skin: Negative.   Neurological: Negative for headaches.  Endo/Heme/Allergies: Negative.   Psychiatric/Behavioral: Negative.     O: Vitals:   12/12/17 0810  BP: 115/75  Pulse: 61  Resp: (!) 97  Temp: 97.6 F (36.4 C)     Physical Exam  Constitutional: He is oriented to person, place, and time. Vital signs are normal. He appears well-developed and well-nourished. He is active.  Non-toxic appearance. He does not have a sickly appearance.  HENT:  Head: Normocephalic.  Right Ear: Hearing, tympanic membrane, external ear and ear canal normal.  Left Ear: Hearing, tympanic membrane, external ear and ear canal normal.  Nose: Nose normal.  Mouth/Throat: Uvula is midline and oropharynx is clear and moist.  Neck: Normal range of motion. Neck supple.  Cardiovascular: Normal rate, regular rhythm, normal heart sounds and normal pulses.  Pulmonary/Chest: Effort normal and breath sounds normal.  Abdominal: Soft. Bowel sounds are normal.  Musculoskeletal: Normal range of motion.  Neck- visually unremarkable with no swelling and full ROM- no lymphnode enlargement and left side is comparable with right side for size and movement. No  acute findings on exam  Lymphadenopathy:       Head (right side): No submental and no submandibular adenopathy present.       Head (left side): No submental and no submandibular adenopathy present.    He has no cervical adenopathy.  Neurological: He is alert and oriented to person, place, and time.  Psychiatric: He has a normal mood and affect.  Vitals reviewed.    A: 1. Neck sprain, initial encounter      P: Exam findings, diagnosis etiology and medication use and indications reviewed with patient. Follow- Up and discharge instructions provided. No emergent/urgent issues found on exam.  Patient verbalized understanding of information provided and agrees with plan of care (POC), all questions answered.  1. Neck sprain, initial encounter - meloxicam (MOBIC) 15 MG tablet; Take 1 tablet (15 mg total) by mouth daily. - cyclobenzaprine (FLEXERIL) 10 MG tablet; Take 1 tablet (10 mg total) by mouth 3 (three) times daily as needed for muscle spasms.  Other orders - ibuprofen (ADVIL,MOTRIN) 400 MG tablet; Take 400 mg by mouth every 6 (six) hours as needed. - Multiple Vitamin (MULTIVITAMIN) tablet; Take 1 tablet by mouth daily.

## 2017-12-26 ENCOUNTER — Ambulatory Visit (INDEPENDENT_AMBULATORY_CARE_PROVIDER_SITE_OTHER): Payer: No Typology Code available for payment source | Admitting: Family Medicine

## 2017-12-26 ENCOUNTER — Encounter: Payer: Self-pay | Admitting: Family Medicine

## 2017-12-26 VITALS — BP 114/80 | HR 88 | Temp 98.3°F | Ht 74.0 in | Wt 202.0 lb

## 2017-12-26 DIAGNOSIS — Z23 Encounter for immunization: Secondary | ICD-10-CM | POA: Diagnosis not present

## 2017-12-26 DIAGNOSIS — M542 Cervicalgia: Secondary | ICD-10-CM | POA: Diagnosis not present

## 2017-12-26 MED ORDER — PREDNISONE 20 MG PO TABS: 40.0000 mg | ORAL_TABLET | Freq: Every day | ORAL | 0 refills | Status: AC

## 2017-12-26 MED FILL — predniSONE 20 MG TABS: 20 | 5 days supply | Qty: 10 | Fill #0

## 2017-12-26 NOTE — Progress Notes (Signed)
Musculoskeletal Exam  Patient: Samuel Spencer DOB: [birth year]  DOS: 12/26/2017  SUBJECTIVE:  Chief Complaint:   Chief Complaint  Patient presents with  . Neck Pain    Samuel NetWilliams Cochran Rippetoe is a 32 y.o.  male for evaluation and treatment of L side of neck pain.   Onset:  1 month ago. No inj or change in activity.  Location: L side of neck, radiating to trap and L shoulder Character:  dull and sharp  Progression of issue:  has worsened Associated symptoms: None Treatment: to date has been muscle relaxers and home exercises.   Neurovascular symptoms: no  ROS: Musculoskeletal/Extremities: +neck pain  Past Medical History:  Diagnosis Date  . Allergy   . Asthma    Allergy induced    Objective: VITAL SIGNS: BP 114/80 (BP Location: Left Arm, Patient Position: Sitting, Cuff Size: Normal)   Pulse 88   Temp 98.3 F (36.8 C) (Oral)   Ht 6\' 2"  (1.88 m)   Wt 202 lb (91.6 kg)   SpO2 99%   BMI 25.94 kg/m  Constitutional: Well formed, well developed. No acute distress. Cardiovascular: Brisk cap refill Thorax & Lungs: No accessory muscle use Musculoskeletal: neck.   Normal active range of motion: decreased L side bend.   Normal passive range of motion: no Tenderness to palpation: L subocc triangle Deformity: no Ecchymosis: no Tests positive: none Tests negative: Spurling's Neurologic: Normal sensory function. No focal deficits noted. DTR's equal and symmetry in UE's. No clonus. Psychiatric: Normal mood. Age appropriate judgment and insight. Alert & oriented x 3.    Assessment:  Neck pain - Plan: predniSONE (DELTASONE) 20 MG tablet  Need for tetanus booster - Plan: Tdap vaccine greater than or equal to 7yo IM  Plan: Orders as above. Heat, ice, stretches, if no improvement in 3-4 weeks, will set up with PT.  F/u prn. The patient voiced understanding and agreement to the plan.   Jilda Rocheicholas Paul [place], DO 12/26/17  2:02 PM

## 2017-12-26 NOTE — Progress Notes (Signed)
Pre visit review using our clinic review tool, if applicable. No additional management support is needed unless otherwise documented below in the visit note. 

## 2017-12-26 NOTE — Patient Instructions (Addendum)
Heat (pad or rice pillow in microwave) over affected area, 10-15 minutes twice daily.   Ice/cold pack over area for 10-15 min twice daily.  OK to take Tylenol 1000 mg (2 extra strength tabs) or 975 mg (3 regular strength tabs) every 6 hours as needed.  Send me a message in 3-4 weeks if no improvement and we will set up physical therapy.  EXERCISES RANGE OF MOTION (ROM) AND STRETCHING EXERCISES  These exercises may help you when beginning to rehabilitate your issue. In order to successfully resolve your symptoms, you must improve your posture. These exercises are designed to help reduce the forward-head and rounded-shoulder posture which contributes to this condition. Your symptoms may resolve with or without further involvement from your physician, physical therapist or athletic trainer. While completing these exercises, remember:   Restoring tissue flexibility helps normal motion to return to the joints. This allows healthier, less painful movement and activity.  An effective stretch should be held for at least 20 seconds, although you may need to begin with shorter hold times for comfort.  A stretch should never be painful. You should only feel a gentle lengthening or release in the stretched tissue.  Do not do any stretch or exercise that you cannot tolerate.  STRETCH- Axial Extensors  Lie on your back on the floor. You may bend your knees for comfort. Place a rolled-up hand towel or dish towel, about 2 inches in diameter, under the part of your head that makes contact with the floor.  Gently tuck your chin, as if trying to make a "double chin," until you feel a gentle stretch at the base of your head.  Hold 15-20 seconds. Repeat 2-3 times. Complete this exercise 1 time per day.   STRETCH - Axial Extension   Stand or sit on a firm surface. Assume a good posture: chest up, shoulders drawn back, abdominal muscles slightly tense, knees unlocked (if standing) and feet hip width  apart.  Slowly retract your chin so your head slides back and your chin slightly lowers. Continue to look straight ahead.  You should feel a gentle stretch in the back of your head. Be certain not to feel an aggressive stretch since this can cause headaches later.  Hold for 15-20 seconds. Repeat 2-3 times. Complete this exercise 1 time per day.  STRETCH - Cervical Side Bend   Stand or sit on a firm surface. Assume a good posture: chest up, shoulders drawn back, abdominal muscles slightly tense, knees unlocked (if standing) and feet hip width apart.  Without letting your nose or shoulders move, slowly tip your right / left ear to your shoulder until your feel a gentle stretch in the muscles on the opposite side of your neck.  Hold 15-20 seconds. Repeat 2-3 times. Complete this exercise 1-2 times per day.  STRETCH - Cervical Rotators   Stand or sit on a firm surface. Assume a good posture: chest up, shoulders drawn back, abdominal muscles slightly tense, knees unlocked (if standing) and feet hip width apart.  Keeping your eyes level with the ground, slowly turn your head until you feel a gentle stretch along the back and opposite side of your neck.  Hold 15-20 seconds. Repeat 2-3 times. Complete this exercise 1-2 times per day.  RANGE OF MOTION - Neck Circles   Stand or sit on a firm surface. Assume a good posture: chest up, shoulders drawn back, abdominal muscles slightly tense, knees unlocked (if standing) and feet hip width apart.  Gently  roll your head down and around from the back of one shoulder to the back of the other. The motion should never be forced or painful.  Repeat the motion 10-20 times, or until you feel the neck muscles relax and loosen. Repeat 2-3 times. Complete the exercise 1-2 times per day. STRENGTHENING EXERCISES - Cervical Strain and Sprain These exercises may help you when beginning to rehabilitate your injury. They may resolve your symptoms with or without  further involvement from your physician, physical therapist, or athletic trainer. While completing these exercises, remember:   Muscles can gain both the endurance and the strength needed for everyday activities through controlled exercises.  Complete these exercises as instructed by your physician, physical therapist, or athletic trainer. Progress the resistance and repetitions only as guided.  You may experience muscle soreness or fatigue, but the pain or discomfort you are trying to eliminate should never worsen during these exercises. If this pain does worsen, stop and make certain you are following the directions exactly. If the pain is still present after adjustments, discontinue the exercise until you can discuss the trouble with your clinician.  STRENGTH - Cervical Flexors, Isometric  Face a wall, standing about 6 inches away. Place a small pillow, a ball about 6-8 inches in diameter, or a folded towel between your forehead and the wall.  Slightly tuck your chin and gently push your forehead into the soft object. Push only with mild to moderate intensity, building up tension gradually. Keep your jaw and forehead relaxed.  Hold 10 to 20 seconds. Keep your breathing relaxed.  Release the tension slowly. Relax your neck muscles completely before you start the next repetition. Repeat 2-3 times. Complete this exercise 1 time per day.  STRENGTH- Cervical Lateral Flexors, Isometric   Stand about 6 inches away from a wall. Place a small pillow, a ball about 6-8 inches in diameter, or a folded towel between the side of your head and the wall.  Slightly tuck your chin and gently tilt your head into the soft object. Push only with mild to moderate intensity, building up tension gradually. Keep your jaw and forehead relaxed.  Hold 10 to 20 seconds. Keep your breathing relaxed.  Release the tension slowly. Relax your neck muscles completely before you start the next repetition. Repeat 2-3  times. Complete this exercise 1 time per day.  STRENGTH - Cervical Extensors, Isometric   Stand about 6 inches away from a wall. Place a small pillow, a ball about 6-8 inches in diameter, or a folded towel between the back of your head and the wall.  Slightly tuck your chin and gently tilt your head back into the soft object. Push only with mild to moderate intensity, building up tension gradually. Keep your jaw and forehead relaxed.  Hold 10 to 20 seconds. Keep your breathing relaxed.  Release the tension slowly. Relax your neck muscles completely before you start the next repetition. Repeat 2-3 times. Complete this exercise 1 time per day.  POSTURE AND BODY MECHANICS CONSIDERATIONS Keeping correct posture when sitting, standing or completing your activities will reduce the stress put on different body tissues, allowing injured tissues a chance to heal and limiting painful experiences. The following are general guidelines for improved posture. Your physician or physical therapist will provide you with any instructions specific to your needs. While reading these guidelines, remember:  The exercises prescribed by your provider will help you have the flexibility and strength to maintain correct postures.  The correct posture provides  the optimal environment for your joints to work. All of your joints have less wear and tear when properly supported by a spine with good posture. This means you will experience a healthier, less painful body.  Correct posture must be practiced with all of your activities, especially prolonged sitting and standing. Correct posture is as important when doing repetitive low-stress activities (typing) as it is when doing a single heavy-load activity (lifting).  PROLONGED STANDING WHILE SLIGHTLY LEANING FORWARD When completing a task that requires you to lean forward while standing in one place for a long time, place either foot up on a stationary 2- to 4-inch high  object to help maintain the best posture. When both feet are on the ground, the low back tends to lose its slight inward curve. If this curve flattens (or becomes too large), then the back and your other joints will experience too much stress, fatigue more quickly, and can cause pain.   RESTING POSITIONS Consider which positions are most painful for you when choosing a resting position. If you have pain with flexion-based activities (sitting, bending, stooping, squatting), choose a position that allows you to rest in a less flexed posture. You would want to avoid curling into a fetal position on your side. If your pain worsens with extension-based activities (prolonged standing, working overhead), avoid resting in an extended position such as sleeping on your stomach. Most people will find more comfort when they rest with their spine in a more neutral position, neither too rounded nor too arched. Lying on a non-sagging bed on your side with a pillow between your knees, or on your back with a pillow under your knees will often provide some relief. Keep in mind, being in any one position for a prolonged period of time, no matter how correct your posture, can still lead to stiffness.  WALKING Walk with an upright posture. Your ears, shoulders, and hips should all line up. OFFICE WORK When working at a desk, create an environment that supports good, upright posture. Without extra support, muscles fatigue and lead to excessive strain on joints and other tissues.  CHAIR:  A chair should be able to slide under your desk when your back makes contact with the back of the chair. This allows you to work closely.  The chair's height should allow your eyes to be level with the upper part of your monitor and your hands to be slightly lower than your elbows.  Body position: ? Your feet should make contact with the floor. If this is not possible, use a foot rest. ? Keep your ears over your shoulders. This will  reduce stress on your neck and low back.

## 2018-01-17 ENCOUNTER — Encounter: Payer: Self-pay | Admitting: Family Medicine

## 2018-01-24 ENCOUNTER — Other Ambulatory Visit: Payer: Self-pay | Admitting: Family Medicine

## 2018-01-24 DIAGNOSIS — M542 Cervicalgia: Secondary | ICD-10-CM

## 2018-01-29 ENCOUNTER — Other Ambulatory Visit: Payer: Self-pay | Admitting: Family Medicine

## 2018-01-29 DIAGNOSIS — Z3009 Encounter for other general counseling and advice on contraception: Secondary | ICD-10-CM

## 2018-03-14 ENCOUNTER — Ambulatory Visit (INDEPENDENT_AMBULATORY_CARE_PROVIDER_SITE_OTHER): Payer: Self-pay | Admitting: Nurse Practitioner

## 2018-03-14 VITALS — BP 110/82 | Temp 98.8°F | Ht 74.0 in | Wt 198.0 lb

## 2018-03-14 DIAGNOSIS — R6889 Other general symptoms and signs: Secondary | ICD-10-CM

## 2018-03-14 LAB — POCT INFLUENZA A/B
INFLUENZA B, POC: NEGATIVE
Influenza A, POC: NEGATIVE

## 2018-03-14 NOTE — Progress Notes (Signed)
Subjective:  Samuel Spencer is a 32 y.o. male who presents for evaluation of influenza like symptoms.  Symptoms include achiness, chills, nasal congestion, productive cough with yellow colored sputum and fatigue.  Onset of symptoms was 2 days ago, and has been gradually worsening since that time.  Treatment to date:  ibuprofen.  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  The patient did inform that he had his influenza vaccine about 4 weeks ago.  The following portions of the patient's history were reviewed and updated as appropriate:  allergies, current medications and past medical history.  Constitutional: positive for anorexia, chills, fatigue and malaise, negative for night sweats, sweats and weight loss Eyes: negative Ears, nose, mouth, throat, and face: positive for nasal congestion and sore throat, negative for ear drainage, earaches and hoarseness Respiratory: positive for cough and sputum, negative for asthma, chronic bronchitis, hemoptysis, pleurisy/chest pain, pneumonia, sputum, stridor and wheezing Cardiovascular: negative Gastrointestinal: positive for nausea and decreased appetite, negative for abdominal pain, constipation, diarrhea and vomiting Neurological: positive for headaches, negative for coordination problems, dizziness, gait problems, paresthesia, speech problems, tremors, vertigo and weakness Objective:  BP 110/82 (BP Location: Right Arm, Patient Position: Sitting)   Temp 98.8 F (37.1 C) (Oral)   Ht 6\' 2"  (1.88 m)   Wt 198 lb (89.8 kg)   SpO2 98%   BMI 25.42 kg/m  General appearance: alert, cooperative, fatigued and no distress Head: Normocephalic, without obvious abnormality, atraumatic Eyes: conjunctivae/corneas clear. PERRL, EOM's intact. Fundi benign. Ears: normal TM's and external ear canals both ears Nose: no discharge, moderate congestion, turbinates swollen,  inflamed, no sinus tenderness Throat: abnormal findings: mild oropharyngeal erythema and no oropharyngeal edema, no exudate Lungs: clear to auscultation bilaterally Heart: regular rate and rhythm, S1, S2 normal, no murmur, click, rub or gallop Abdomen: soft, non-tender; bowel sounds normal; no masses,  no organomegaly Pulses: 2+ and symmetric Skin: Skin color, texture, turgor normal. No rashes or lesions Lymph nodes: cervical and submandibular nodes normal Neurologic: Grossly normal    Assessment:  Influenza-Like Symptoms    Plan:   Exam findings, diagnosis etiology and medication use and indications reviewed with patient. Follow- Up and discharge instructions provided. No emergent/urgent issues found on exam. Patient education was provided. Patient verbalized understanding of information provided and agrees with plan of care (POC), all questions answered. The patient is advised to call or return to clinic if condition does not see an improvement in symptoms, or to seek the care of the closest emergency department if condition worsens with the above plan.   1. Influenza-like symptoms  - POCT Influenza A/B-negative -Symptomatic treatment to include increase fluids, antipyretics for fever, and a BRAT diet for nausea symptoms. -Rest as much as possible. -Warm salt water gargles for throat pain or discomfort.  May also use a teaspoon of honey as needed for throat pain or discomfort. -Administer Ibuprofen 800mg  every 8 hours for fever.  May use Tylenol 500mg  1-2 tabs every 6 hours as needed for breakthrough fever. -Follow-up in our office if symptoms do not improve within the next 48 to 72 hours or any other concerns. -The patient was given a work note for [employer].  Patient is scheduled to return to work on Monday.

## 2018-03-14 NOTE — Patient Instructions (Addendum)
Influenza-Like Symptoms, Adult -Symptomatic treatment to include increase fluids, antipyretics for fever, and a BRAT diet for nausea symptoms. -Rest as much as possible. -Warm salt water gargles for throat pain or discomfort.  May also use a teaspoon of honey as needed for throat pain or discomfort. -Administer Ibuprofen 800mg  every 8 hours for fever.  May use Tylenol 500mg  1-2 tabs every 6 hours as needed for breakthrough fever. -Follow-up in our office if symptoms do not improve within the next 48 to 72 hours or any other concerns.  Influenza, more commonly known as "the flu," is a viral infection that primarily affects the respiratory tract. The respiratory tract includes organs that help you breathe, such as the lungs, nose, and throat. The flu causes many common cold symptoms, as well as a high fever and body aches. The flu spreads easily from person to person (is contagious). Getting a flu shot (influenza vaccination) every year is the best way to prevent influenza. What are the causes? Influenza is caused by a virus. You can catch the virus by:  Breathing in droplets from an infected person's cough or sneeze.  Touching something that was recently contaminated with the virus and then touching your mouth, nose, or eyes.  What increases the risk? The following factors may make you more likely to get the flu:  Not cleaning your hands frequently with soap and water or alcohol-based hand sanitizer.  Having close contact with many people during cold and flu season.  Touching your mouth, eyes, or nose without washing or sanitizing your hands first.  Not drinking enough fluids or not eating a healthy diet.  Not getting enough sleep or exercise.  Being under a high amount of stress.  Not getting a yearly (annual) flu shot.  You may be at a higher risk of complications from the flu, such as a severe lung infection (pneumonia), if you:  Are over the age of 86.  Are pregnant.  Have a  weakened disease-fighting system (immune system). You may have a weakened immune system if you: ? Have HIV or AIDS. ? Are undergoing chemotherapy. ? Aretaking medicines that reduce the activity of (suppress) the immune system.  Have a long-term (chronic) illness, such as heart disease, kidney disease, diabetes, or lung disease.  Have a liver disorder.  Are obese.  Have anemia.  What are the signs or symptoms? Symptoms of this condition typically last 4-10 days and may include:  Fever.  Chills.  Headache, body aches, or muscle aches.  Sore throat.  Cough.  Runny or congested nose.  Chest discomfort and cough.  Poor appetite.  Weakness or tiredness (fatigue).  Dizziness.  Nausea or vomiting.  How is this diagnosed? This condition may be diagnosed based on your medical history and a physical exam. Your health care provider may do a nose or throat swab test to confirm the diagnosis. How is this treated? If influenza is detected early, you can be treated with antiviral medicine that can reduce the length of your illness and the severity of your symptoms. This medicine may be given by mouth (orally) or through an IV tube that is inserted in one of your veins. The goal of treatment is to relieve symptoms by taking care of yourself at home. This may include taking over-the-counter medicines, drinking plenty of fluids, and adding humidity to the air in your home. In some cases, influenza goes away on its own. Severe influenza or complications from influenza may be treated in a hospital. Follow  these instructions at home:  Take over-the-counter and prescription medicines only as told by your health care provider.  Use a cool mist humidifier to add humidity to the air in your home. This can make breathing easier.  Rest as needed.  Drink enough fluid to keep your urine clear or pale yellow.  Cover your mouth and nose when you cough or sneeze.  Wash your hands with soap  and water often, especially after you cough or sneeze. If soap and water are not available, use hand sanitizer.  Stay home from work or school as told by your health care provider. Unless you are visiting your health care provider, try to avoid leaving home until your fever has been gone for 24 hours without the use of medicine.  Keep all follow-up visits as told by your health care provider. This is important. How is this prevented?  Getting an annual flu shot is the best way to avoid getting the flu. You may get the flu shot in late summer, fall, or winter. Ask your health care provider when you should get your flu shot.  Wash your hands often or use hand sanitizer often.  Avoid contact with people who are sick during cold and flu season.  Eat a healthy diet, drink plenty of fluids, get enough sleep, and exercise regularly. Contact a health care provider if:  You develop new symptoms.  You have: ? Chest pain. ? Diarrhea. ? A fever.  Your cough gets worse.  You produce more mucus.  You feel nauseous or you vomit. Get help right away if:  You develop shortness of breath or difficulty breathing.  Your skin or nails turn a bluish color.  You have severe pain or stiffness in your neck.  You develop a sudden headache or sudden pain in your face or ear.  You cannot stop vomiting. This information is not intended to replace advice given to you by your health care provider. Make sure you discuss any questions you have with your health care provider. Document Released: 05/04/2000 Document Revised: 10/13/2015 Document Reviewed: 03/01/2015 Elsevier Interactive Patient Education  2017 [employer].

## 2018-03-17 ENCOUNTER — Telehealth: Payer: Self-pay

## 2018-03-17 NOTE — Telephone Encounter (Signed)
Patient hung up the phone.

## 2018-05-15 ENCOUNTER — Ambulatory Visit (HOSPITAL_COMMUNITY)
Admission: EM | Admit: 2018-05-15 | Discharge: 2018-05-15 | Disposition: A | Discharge status: Home / Self Care | Payer: No Typology Code available for payment source | Attending: Family Medicine | Admitting: Family Medicine

## 2018-05-15 ENCOUNTER — Encounter (HOSPITAL_COMMUNITY): Payer: Self-pay | Admitting: Emergency Medicine

## 2018-05-15 ENCOUNTER — Other Ambulatory Visit: Payer: Self-pay

## 2018-05-15 ENCOUNTER — Ambulatory Visit (INDEPENDENT_AMBULATORY_CARE_PROVIDER_SITE_OTHER): Payer: No Typology Code available for payment source

## 2018-05-15 DIAGNOSIS — S62344A Nondisplaced fracture of base of fourth metacarpal bone, right hand, initial encounter for closed fracture: Secondary | ICD-10-CM

## 2018-05-15 DIAGNOSIS — W298XXA Contact with other powered powered hand tools and household machinery, initial encounter: Secondary | ICD-10-CM

## 2018-05-15 DIAGNOSIS — S62324A Displaced fracture of shaft of fourth metacarpal bone, right hand, initial encounter for closed fracture: Secondary | ICD-10-CM | POA: Insufficient documentation

## 2018-05-15 MED ORDER — HYDROCODONE-ACETAMINOPHEN 7.5-325 MG PO TABS: 1.0000 | ORAL_TABLET | Freq: Four times a day (QID) | ORAL | 0 refills | Status: DC | PRN

## 2018-05-15 MED ORDER — IBUPROFEN 800 MG PO TABS: 800.0000 mg | ORAL_TABLET | Freq: Three times a day (TID) | ORAL | 0 refills | Status: DC

## 2018-05-15 NOTE — ED Triage Notes (Signed)
Was using a high -powered drill today.  Drill kicked back and felt pop in right hand.  Pain in posterior hand , behind ring finger.  Brisk cap refill, radial pulse 2 plus

## 2018-05-15 NOTE — ED Provider Notes (Signed)
[facility]    CSN: [id number] Arrival date & time: 05/15/18  1524     History   Chief Complaint Chief Complaint  Patient presents with  . Hand Injury    HPI Samuel Spencer is a 32 y.o. male.   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  He was using a drill today on a home project and it "kicked back" causing a wrenching injury to his right hand.  It is swollen and very painful.  He points directly to his fourth metacarpal.  He can flex and extend his wrists, moderately, and flex and extend his fingers, with pain.  There is no rotational defect of the fingers.  Sensory exam is normal.   Past Medical History:  Diagnosis Date  . Allergy   . Asthma    Allergy induced    Patient Active Problem List   Diagnosis Date Noted  . Well adult exam 10/01/2017    Past Surgical History:  Procedure Laterality Date  . EYE SURGERY         Home Medications    Prior to Admission medications   Medication Sig Start Date End Date Taking? Authorizing Provider  Multiple Vitamin (MULTIVITAMIN) tablet Take 1 tablet by mouth daily.   Yes [provider]  albuterol (VENTOLIN HFA) 108 (90 Base) MCG/ACT inhaler Inhale 1-2 puffs into the lungs every 6 (six) hours as needed for wheezing or shortness of breath. 10/01/17   Sharlene Dory, DO  HYDROcodone-acetaminophen (NORCO) 7.5-325 MG tablet Take 1 tablet by mouth every 6 (six) hours as needed for moderate pain. 05/15/18   Eustace Moore, MD  ibuprofen (ADVIL,MOTRIN) 800 MG tablet Take 1 tablet (800 mg total) by mouth 3 (three) times daily. 05/15/18   Eustace Moore, MD    Family History Family History  Problem Relation Age of Onset  . Arthritis Mother   . Drug abuse Father   . Alcohol abuse Father   . Cancer Neg Hx     Social History Social History   Tobacco Use  . Smoking status: Never Smoker  . Smokeless tobacco: Never Used  Substance Use Topics  . Alcohol use: Yes  .  Drug use: No     Allergies   Latex   Review of Systems Review of Systems  Constitutional: Negative for chills and fever.  HENT: Negative for ear pain and sore throat.   Eyes: Negative for pain and visual disturbance.  Respiratory: Negative for cough and shortness of breath.   Cardiovascular: Negative for chest pain and palpitations.  Gastrointestinal: Negative for abdominal pain and vomiting.  Genitourinary: Negative for dysuria and hematuria.  Musculoskeletal: Positive for arthralgias. Negative for back pain.       Hand pain  Skin: Negative for color change and rash.  Neurological: Negative for seizures and syncope.  All other systems reviewed and are negative.    Physical Exam Triage Vital Signs ED Triage Vitals  Enc Vitals Group     BP 05/15/18 1700 136/79     Pulse Rate 05/15/18 1700 97     Resp 05/15/18 1700 18     Temp 05/15/18 1700 (!) 97.4 F (36.3 C)     Temp Source 05/15/18 1700 Oral     SpO2 05/15/18 1700 99 %     Weight --      Height --      Head Circumference --      Peak Flow --  Pain Score 05/15/18 1657 7     Pain Loc --      Pain Edu? --      Excl. in GC? --    No data found.  Updated Vital Signs BP 136/79 (BP Location: Left Arm)   Pulse 97   Temp (!) 97.4 F (36.3 C) (Oral)   Resp 18   SpO2 99%      Physical Exam Constitutional:      General: He is not in acute distress.    Appearance: He is well-developed.  HENT:     Head: Normocephalic and atraumatic.  Eyes:     Conjunctiva/sclera: Conjunctivae normal.     Pupils: Pupils are equal, round, and reactive to light.  Neck:     Musculoskeletal: Normal range of motion.  Cardiovascular:     Rate and Rhythm: Normal rate.  Pulmonary:     Effort: Pulmonary effort is normal. No respiratory distress.  Abdominal:     General: There is no distension.     Palpations: Abdomen is soft.  Musculoskeletal: Normal range of motion.     Comments: Hand is examined.  There is mild soft tissue  swelling over the dorsum.  No ecchymosis.  There is point tenderness over the fourth metacarpal.  Flexion extension revealed no rotational defect.  Sensory exam is normal.  Skin:    General: Skin is warm and dry.  Neurological:     General: No focal deficit present.     Mental Status: He is alert.  Psychiatric:        Mood and Affect: Mood normal.        Thought Content: Thought content normal.      UC Treatments / Results  Labs (all labs ordered are listed, but only abnormal results are displayed) Labs Reviewed - No data to display  EKG None  Radiology Dg Hand Complete Right  Result Date: 05/15/2018 CLINICAL DATA:  RIGHT hand injured when drill kicked backwards 2 hours ago EXAM: RIGHT HAND - COMPLETE 3+ VIEW COMPARISON:  None FINDINGS: Osseous mineralization normal. Joint spaces preserved. Oblique essentially nondisplaced fracture at proximal shaft of fourth metacarpal. No additional fracture, dislocation, or bone destruction. IMPRESSION: Oblique essentially nondisplaced oblique fracture of the fourth metacarpal diaphysis proximally. Electronically Signed   By: Ulyses SouthwardMark  Boles M.D.   On: 05/15/2018 17:18    Procedures Procedures (including critical care time)  Medications Ordered in UC Medications - No data to display  Initial Impression / Assessment and Plan / UC Course  I have reviewed the triage vital signs and the nursing notes.  Pertinent labs & imaging results that were available during my care of the patient were reviewed by me and considered in my medical decision making (see chart for details).     Request [facility].  [facility] doctor on-call was consulted.  Patient was put into an ulnar splint.  Given medication for pain.  Instructions on care.  Return here as needed Final Clinical Impressions(s) / UC Diagnoses   Final diagnoses:  Closed displaced fracture of shaft of fourth metacarpal bone of right hand, initial encounter     Discharge  Instructions     Ice Elevate Take ibuprofen as needed for pain Take the hydrocodone if pain severe Do not drive on the hydrocodone Call orthopedic tomorrow to set up appointment next week DO NOT REMOVE SPLINT   ED Prescriptions    Medication Sig Dispense Auth. Provider   ibuprofen (ADVIL,MOTRIN) 800 MG tablet Take 1 tablet (  800 mg total) by mouth 3 (three) times daily. 21 tablet Eustace MooreNelson, Zadrian Mccauley Sue, MD   HYDROcodone-acetaminophen [facility](NORCO) 7.5-325 MG tablet Take 1 tablet by mouth every 6 (six) hours as needed for moderate pain. 10 tablet Eustace MooreNelson, Oden Lindaman Sue, MD     Controlled Substance Prescriptions [place] Controlled Substance Registry consulted? Not Applicable   Eustace MooreNelson, Sylvi Rybolt Sue, MD 05/15/18 Serena [id number]

## 2018-05-15 NOTE — Discharge Instructions (Signed)
Ice Elevate Take ibuprofen as needed for pain Take the hydrocodone if pain severe Do not drive on the hydrocodone Call orthopedic tomorrow to set up appointment next week DO NOT REMOVE SPLINT

## 2018-05-15 NOTE — Progress Notes (Signed)
Orthopedic Tech Progress Note Patient Details:  Gaetano NetWilliams Cochran Colin March 25, 1986 [id number]  Ortho Devices Type of Ortho Device: Arm sling, Ulna gutter splint Ortho Device/Splint Location: rue Ortho Device/Splint Interventions: Ordered, Application, Adjustment   Post Interventions Patient Tolerated: Well Instructions Provided: Care of device, Adjustment of device   Trinna PostMartinez, Kinlee Garrison J 05/15/2018, 6:30 PM

## 2018-05-16 ENCOUNTER — Encounter: Payer: Self-pay | Admitting: Family Medicine

## 2018-05-16 ENCOUNTER — Other Ambulatory Visit: Payer: Self-pay | Admitting: Family Medicine

## 2018-05-16 NOTE — Telephone Encounter (Signed)
Right hand. See previous mychart message.

## 2018-05-19 ENCOUNTER — Other Ambulatory Visit: Payer: Self-pay | Admitting: Family Medicine

## 2018-05-19 DIAGNOSIS — S92341A Displaced fracture of fourth metatarsal bone, right foot, initial encounter for closed fracture: Secondary | ICD-10-CM

## 2018-05-20 ENCOUNTER — Encounter (INDEPENDENT_AMBULATORY_CARE_PROVIDER_SITE_OTHER): Payer: Self-pay | Admitting: Family Medicine

## 2018-05-20 ENCOUNTER — Ambulatory Visit (INDEPENDENT_AMBULATORY_CARE_PROVIDER_SITE_OTHER): Payer: No Typology Code available for payment source | Admitting: Family Medicine

## 2018-05-20 DIAGNOSIS — S62324A Displaced fracture of shaft of fourth metacarpal bone, right hand, initial encounter for closed fracture: Secondary | ICD-10-CM

## 2018-05-20 NOTE — Progress Notes (Signed)
   Office Visit Note   Patient: Samuel Spencer           Date of Birth: 11-Nov-1985           MRN: [id number] Visit Date: 05/20/2018 Requested by: Sharlene DoryWendling, Nicholas Paul, DO [address] [place], [place] [address] PCP: Sharlene DoryWendling, Nicholas Paul, DO  Subjective: Chief Complaint  Patient presents with  . Right Hand - Pain, Injury    DOI 05/15/18 - twist injury using a big drill - was placed in ulna gutter splint and sling at [facility].    HPI: Is a 32 year old right-hand-dominant male with right hand fracture.  About 3 or 4 days ago he was using a drill and a little bit caught, causing his hand to get torqued.  He felt immediate pain.  He went to the hospital where x-rays revealed a fourth metacarpal shaft fracture.  He was placed in a splint and referred here for evaluation.  He has had finger fractures in the past but never any hand injuries.  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  He is otherwise in good health.              ROS: Otherwise noncontributory  Objective: Vital Signs: There were no vitals taken for this visit.  Physical Exam:  Right hand: Very slight shortening of the fourth metacarpal, and very subtle rotation when he flexes his fingers, his fourth finger deviates slightly toward the fifth compared to his asymptomatic left hand fourth finger.  Moderately tender at the fourth metacarpal shaft.  Imaging: None today.  Assessment & Plan: 1.  4 days status post right hand fourth metacarpal shaft fracture with slight shortening and very subtle rotation but acceptably aligned. -Ulnar gutter splint with wrist dorsiflexed 45 degrees and MCP joints flexed to 90 degrees.  He will schedule with occupational therapist for hand-based splint as soon as able. -Return in 1 week for 3 view hand x-rays to assess fracture position.   Follow-Up Instructions: Return in about 1 week (around 05/27/2018).      Procedures: No procedures performed  No notes on  file    PMFS History: Patient Active Problem List   Diagnosis Date Noted  . Well adult exam 10/01/2017   Past Medical History:  Diagnosis Date  . Allergy   . Asthma    Allergy induced    Family History  Problem Relation Age of Onset  . Arthritis Mother   . Drug abuse Father   . Alcohol abuse Father   . Cancer Neg Hx     Past Surgical History:  Procedure Laterality Date  . EYE SURGERY     Social History   Occupational History  . Not on file  Tobacco Use  . Smoking status: Never Smoker  . Smokeless tobacco: Never Used  Substance and Sexual Activity  . Alcohol use: Yes  . Drug use: No  . Sexual activity: Yes    Partners: Female

## 2018-05-20 NOTE — Addendum Note (Signed)
Addended by: Lillia CarmelHILTS, Anely Spiewak J on: 05/20/2018 03:37 PM   Modules accepted: Orders

## 2018-05-26 ENCOUNTER — Ambulatory Visit (INDEPENDENT_AMBULATORY_CARE_PROVIDER_SITE_OTHER): Payer: No Typology Code available for payment source | Admitting: Family Medicine

## 2018-05-26 ENCOUNTER — Encounter (INDEPENDENT_AMBULATORY_CARE_PROVIDER_SITE_OTHER): Payer: Self-pay | Admitting: Family Medicine

## 2018-05-26 ENCOUNTER — Ambulatory Visit (INDEPENDENT_AMBULATORY_CARE_PROVIDER_SITE_OTHER): Payer: No Typology Code available for payment source

## 2018-05-26 ENCOUNTER — Telehealth: Payer: Self-pay | Admitting: *Deleted

## 2018-05-26 DIAGNOSIS — S62324D Displaced fracture of shaft of fourth metacarpal bone, right hand, subsequent encounter for fracture with routine healing: Secondary | ICD-10-CM

## 2018-05-26 NOTE — Progress Notes (Signed)
   Office Visit Note   Patient: Samuel Spencer           Date of Birth: December 04, 1985           MRN: [id number] Visit Date: 05/26/2018 Requested by: Sharlene Dory, DO [address] [place], [place] [address] PCP: Sharlene Dory, DO  Subjective: Chief Complaint  Patient presents with  . Right Hand - Fracture, Follow-up    Been wearing [place] splint and buddy-taping III & IV    HPI: He is about a week and half status post right hand fourth metacarpal shaft fracture with slight shortening.  Doing well in his [place] splint, pain steadily improving.  He remains out of work until he is able to perform CPR with his hand.              ROS: Noncontributory  Objective: Vital Signs: There were no vitals taken for this visit.  Physical Exam:  Right hand: Mild tenderness on the dorsum of his wrist and near the capitate bone.  Mild tenderness at the fourth metacarpal shaft.  Able to make a full fist, the fourth finger deviates very slightly toward the fifth compared to the left hand at full flexion but this is very minimal.  Imaging: Right hand x-rays: Fourth metacarpal shaft fracture alignment is unchanged from previous, no further shortening.  Assessment & Plan: 1.  Stable 1-1/2 weeks status post right hand fourth metacarpal shaft fracture with slight shortening and very subtle rotation, likely insignificant -Continue with current treatment, return in about 2 weeks for repeat hand x-rays.  If good callus formation present, we may be able to stop using the [place] splint at that point.  Probably will be ready to perform CPR until 5 or 6 weeks post injury.   Follow-Up Instructions: Return in about 2 weeks (around 06/09/2018).      Procedures: No procedures performed  No notes on file    PMFS History: Patient Active Problem List   Diagnosis Date Noted  . Well adult exam 10/01/2017   Past Medical History:  Diagnosis Date  . Allergy   .  Asthma    Allergy induced    Family History  Problem Relation Age of Onset  . Arthritis Mother   . Drug abuse Father   . Alcohol abuse Father   . Cancer Neg Hx     Past Surgical History:  Procedure Laterality Date  . EYE SURGERY     Social History   Occupational History  . Not on file  Tobacco Use  . Smoking status: Never Smoker  . Smokeless tobacco: Never Used  Substance and Sexual Activity  . Alcohol use: Yes  . Drug use: No  . Sexual activity: Yes    Partners: Female

## 2018-05-26 NOTE — Telephone Encounter (Signed)
Received FMLA/STD paperwork from [employer], completed as much as possible; forwarded to provider/SLS 01/06

## 2018-05-31 ENCOUNTER — Encounter (INDEPENDENT_AMBULATORY_CARE_PROVIDER_SITE_OTHER): Payer: Self-pay | Admitting: Family Medicine

## 2018-06-10 ENCOUNTER — Ambulatory Visit (INDEPENDENT_AMBULATORY_CARE_PROVIDER_SITE_OTHER): Payer: No Typology Code available for payment source

## 2018-06-10 ENCOUNTER — Ambulatory Visit (INDEPENDENT_AMBULATORY_CARE_PROVIDER_SITE_OTHER): Payer: No Typology Code available for payment source | Admitting: Family Medicine

## 2018-06-10 ENCOUNTER — Encounter (INDEPENDENT_AMBULATORY_CARE_PROVIDER_SITE_OTHER): Payer: Self-pay | Admitting: Family Medicine

## 2018-06-10 DIAGNOSIS — S62324D Displaced fracture of shaft of fourth metacarpal bone, right hand, subsequent encounter for fracture with routine healing: Secondary | ICD-10-CM

## 2018-06-10 NOTE — Progress Notes (Signed)
   Office Visit Note   Patient: Samuel Spencer           Date of Birth: 14-Jul-1985           MRN: [id number] Visit Date: 06/10/2018 Requested by: Sharlene DoryWendling, Nicholas Paul, DO [address] [place], [place] [address] PCP: Sharlene DoryWendling, Nicholas Paul, DO  Subjective: Chief Complaint  Patient presents with  . Right Hand - Fracture, Follow-up    3 weeks, 5 days post injury (05/15/18). Some stiffness in the hand. Been wearing the [place] splint.     HPI: He is about a month status post right hand fourth metacarpal shaft fracture.  Doing steadily better, pain improved significantly since last visit.              ROS: Noncontributory  Objective: Vital Signs: There were no vitals taken for this visit.  Physical Exam:  Right hand: Very slight tenderness to palpation of the fourth metacarpal shaft.  Good range of motion of his fingers, very subtle rotation still present but unchanged.  Imaging: X-rays right hand: Steady improvement in callus formation of the fourth metacarpal shaft fracture with no further shortening or angulation.  Assessment & Plan: 1.  Clinically healing 1 month status post right hand fourth metacarpal shaft fracture -I do not think he is quite ready for CPR yet so he will stay out of work for 2 more weeks.  As long as he is pain-free at that point he will resume regular work and follow-up as needed.  If still having pain we will take 1 more x-ray.   Follow-Up Instructions: No follow-ups on file.      Procedures: No procedures performed  No notes on file    PMFS History: Patient Active Problem List   Diagnosis Date Noted  . Well adult exam 10/01/2017   Past Medical History:  Diagnosis Date  . Allergy   . Asthma    Allergy induced    Family History  Problem Relation Age of Onset  . Arthritis Mother   . Drug abuse Father   . Alcohol abuse Father   . Cancer Neg Hx     Past Surgical History:  Procedure Laterality Date  . EYE  SURGERY     Social History   Occupational History  . Not on file  Tobacco Use  . Smoking status: Never Smoker  . Smokeless tobacco: Never Used  Substance and Sexual Activity  . Alcohol use: Yes  . Drug use: No  . Sexual activity: Yes    Partners: Female

## 2018-06-11 ENCOUNTER — Encounter (INDEPENDENT_AMBULATORY_CARE_PROVIDER_SITE_OTHER): Payer: Self-pay | Admitting: Family Medicine

## 2018-10-02 ENCOUNTER — Other Ambulatory Visit: Payer: Self-pay

## 2018-10-02 ENCOUNTER — Ambulatory Visit (INDEPENDENT_AMBULATORY_CARE_PROVIDER_SITE_OTHER): Payer: Self-pay | Admitting: Physician Assistant

## 2018-10-02 VITALS — BP 130/90 | HR 89 | Temp 98.6°F | Resp 14 | Wt 203.6 lb

## 2018-10-02 DIAGNOSIS — H00011 Hordeolum externum right upper eyelid: Secondary | ICD-10-CM

## 2018-10-02 NOTE — Patient Instructions (Signed)
Stye  -Apply warm compress to the affected eye for 10-15 minutes 4-5 times during the day until symptoms improve.  -Avoid aggressively rubbing or cleansing of the eye.  -Use Johnson's baby shampoo to wash the affected area.  -If no improvement in 2-3 days, return for further evaluation.  -If you develop fever, chills,or inability to move eye, seek medical care immediately at ED.    A stye, also known as a hordeolum, is a bump that forms on an eyelid. It may look like a pimple next to the eyelash. A stye can form inside the eyelid (internal stye) or outside the eyelid (external stye). A stye can cause redness, swelling, and pain on the eyelid. Styes are very common. Anyone can get them at any age. They usually occur in just one eye, but you may have more than one in either eye. What are the causes? A stye is caused by an infection. The infection is almost always caused by bacteria called Staphylococcus aureus. This is a common type of bacteria that lives on the skin. An internal stye may result from an infected oil-producing gland inside the eyelid. An external stye may be caused by an infection at the base of the eyelash (hair follicle). What increases the risk? You are more likely to develop a stye if:  You have had a stye before.  You have any of these conditions: ? Diabetes. ? Red, itchy, inflamed eyelids (blepharitis). ? A skin condition such as seborrheic dermatitis or rosacea. ? High fat levels in your blood (lipids). What are the signs or symptoms? The most common symptom of a stye is eyelid pain. Internal styes are more painful than external styes. Other symptoms may include:  Painful swelling of your eyelid.  A scratchy feeling in your eye.  Tearing and redness of your eye.  Pus draining from the stye. How is this diagnosed? Your health care provider may be able to diagnose a stye just by examining your eye. The health care provider may also check to make sure:  You do  not have a fever or other signs of a more serious infection.  The infection has not spread to other parts of your eye or areas around your eye. How is this treated? Most styes will clear up in a few days without treatment or with warm compresses applied to the area. You may need to use antibiotic drops or ointment to treat an infection. In some cases, if your stye does not heal with routine treatment, your health care provider may drain pus from the stye using a thin blade or needle. This may be done if the stye is large, causing a lot of pain, or affecting your vision. Follow these instructions at home:  Take over-the-counter and prescription medicines only as told by your health care provider. This includes eye drops or ointments.  If you were prescribed an antibiotic medicine, apply or use it as told by your health care provider. Do not stop using the antibiotic even if your condition improves.  Apply a warm, wet cloth (warm compress) to your eye for 5-10 minutes, 4 times a day.  Clean the affected eyelid as directed by your health care provider.  Do not wear contact lenses or eye makeup until your stye has healed.  Do not try to pop or drain the stye.  Do not rub your eye. Contact a health care provider if:  You have chills or a fever.  Your stye does not go away after  several days.  Your stye affects your vision.  Your eyeball becomes swollen, red, or painful. Get help right away if:  You have pain when moving your eye around. Summary  A stye is a bump that forms on an eyelid. It may look like a pimple next to the eyelash.  A stye can form inside the eyelid (internal stye) or outside the eyelid (external stye). A stye can cause redness, swelling, and pain on the eyelid.  Your health care provider may be able to diagnose a stye just by examining your eye.  Apply a warm, wet cloth (warm compress) to your eye for 5-10 minutes, 4 times a day. This information is not  intended to replace advice given to you by your health care provider. Make sure you discuss any questions you have with your health care provider. Document Released: 02/14/2005 Document Revised: 01/17/2017 Document Reviewed: 01/17/2017 Elsevier Interactive Patient Education  2019 [employer]

## 2018-10-02 NOTE — Progress Notes (Signed)
Massai Mandujano  MRN: [id number] DOB: [birth year]  Subjective:  Jeremai Russin is a 33 y.o. male seen in office today for a chief complaint of eyelid irritation x 3 days. Has associated pain and swelling of upper eyelid. It improves after taking a warm shower. Has not tried anything for relief. Denies eye pain, eye itching, photophobia, eye redness, eye discharge, visual disturbance, and foreign body sensation. Denies trauma. Does not wear contact lens or eyeglasses. PMH of seasonal allergies. Denies PMH of DM, HTN, MRSA, and HIV. PSH of eye surgery for amblyopia of left eye Denies smoking.  No other questions or concerns.   Review of Systems  Constitutional: Negative for chills, diaphoresis and fever.  Gastrointestinal: Negative for nausea and vomiting.  Neurological: Negative for headaches.    Patient Active Problem List   Diagnosis Date Noted  . Well adult exam 10/01/2017    Current Outpatient Medications on File Prior to Visit  Medication Sig Dispense Refill  . Multiple Vitamin (MULTIVITAMIN) tablet Take 1 tablet by mouth daily.    Marland Kitchen albuterol (VENTOLIN HFA) 108 (90 Base) MCG/ACT inhaler Inhale 1-2 puffs into the lungs every 6 (six) hours as needed for wheezing or shortness of breath.     No current facility-administered medications on file prior to visit.     Allergies  Allergen Reactions  . Latex      Objective:  BP 130/90   Pulse 89   Temp 98.6 F (37 C)   Resp 14   Wt 203 lb 9.6 oz (92.4 kg)   SpO2 99%   BMI 26.14 kg/m   Physical Exam Vitals signs reviewed.  Constitutional:      Appearance: He is well-developed.  HENT:     Head: Normocephalic and atraumatic.  Eyes:     General:        Right eye: Hordeolum (on medial aspect of inner upper eyelid, pinpoint TTP, mild localized erythema and edema surrounding hordeolum) present. No foreign body or discharge.        Left eye: No discharge or hordeolum.     Extraocular Movements: Extraocular  movements intact.     Conjunctiva/sclera: Conjunctivae normal.     Right eye: Right conjunctiva is not injected. No chemosis.    Visual Fields: Right eye visual fields normal.     Comments: No pain with palpation of b/l periorbital regions.  Neck:     Musculoskeletal: Normal range of motion.  Pulmonary:     Effort: Pulmonary effort is normal.  Lymphadenopathy:     Head:     Right side of head: No submental, submandibular, tonsillar, preauricular, posterior auricular or occipital adenopathy.     Left side of head: No submental, submandibular, tonsillar, preauricular, posterior auricular or occipital adenopathy.     Cervical: No cervical adenopathy.     Upper Body:     Right upper body: No supraclavicular adenopathy.     Left upper body: No supraclavicular adenopathy.  Skin:    General: Skin is warm and dry.  Neurological:     Mental Status: He is alert and oriented to person, place, and time.       Visual Acuity Screening   Right eye Left eye Both eyes  Without correction: 20/25 20/20   With correction:       Assessment and Plan :  1. Hordeolum of right upper eyelid, unspecified hordeolum type Patient is overall well-appearing, no acute distress.  VSS.  He is afebrile.  Normal visual acuity.  History physical exam findings consistent with hordeolum of right upper eyelid.  No concern for preseptal cellulitis at this time.  Erythema and edema of right upper eyelid is localized to hordeolum.   Reassuring that he is having improvement with warm showers.  Would suggest topical warm compresses for 10 to 15 minutes 4-5 times a day on affected eyelid.  May use Johnson's baby shampoo to cleanse affected eye.  Avoid excessive rubbing.  Return to office if no improvement in 2 days with current treatment plan.  Encouraged to seek care sooner at local ED if he develops any fever, chills, inability to move eye, visual disturbance, or other concerning symptoms.  He voices his understanding and  agrees to plan.  Benjiman CoreBrittany , Cordelia Poche-C  [facility] 10/02/2018 2:06 PM

## 2019-03-26 ENCOUNTER — Ambulatory Visit
Admission: EM | Admit: 2019-03-26 | Discharge: 2019-03-26 | Disposition: A | Discharge status: Home / Self Care | Payer: No Typology Code available for payment source | Attending: Physician Assistant | Admitting: Physician Assistant

## 2019-03-26 DIAGNOSIS — M542 Cervicalgia: Secondary | ICD-10-CM | POA: Diagnosis not present

## 2019-03-26 MED ORDER — METHYLPREDNISOLONE SODIUM SUCC 125 MG IJ SOLR
80.0000 mg | Freq: Once | INTRAMUSCULAR | Status: AC
  Administered 2019-03-26: 80 mg via INTRAMUSCULAR

## 2019-03-26 MED ORDER — TIZANIDINE HCL 4 MG PO TABS: 4.0000 mg | ORAL_TABLET | Freq: Four times a day (QID) | ORAL | 0 refills | Status: DC | PRN

## 2019-03-26 MED FILL — tiZANidine HCL 4 MG TABS: 4 | 4 days supply | Qty: 15 | Fill #0

## 2019-03-26 NOTE — ED Provider Notes (Signed)
[facility]    CSN: [id number] Arrival date & time: 03/26/19  4401      History   Chief Complaint Chief Complaint  Patient presents with  . Neck Pain    HPI Samuel Spencer is a 33 y.o. male.   33 year old male comes in for 2-day history of left neck pain that is radiating to the shoulder.  Denies injury/trauma.  States has history of pinched nerve through the neck, with exacerbation 1-2 times a year.  States he woke up with the symptoms, with pain that is intermittent, sharp/aching in sensation, worse with movement.  He does notice some intermittent tingling to the fourth and fifth finger of the left hand.  Denies loss of grip strength.  He has been taking ibuprofen 800 mg twice a day, Flexeril 5 mg daily without relief.     Past Medical History:  Diagnosis Date  . Allergy   . Asthma    Allergy induced    Patient Active Problem List   Diagnosis Date Noted  . Well adult exam 10/01/2017    Past Surgical History:  Procedure Laterality Date  . EYE SURGERY         Home Medications    Prior to Admission medications   Medication Sig Start Date End Date Taking? Authorizing Provider  Multiple Vitamin (MULTIVITAMIN) tablet Take 1 tablet by mouth daily.    [provider]  tiZANidine (ZANAFLEX) 4 MG tablet Take 1 tablet (4 mg total) by mouth every 6 (six) hours as needed for muscle spasms. 03/26/19   Belinda Fisher, PA-C    Family History Family History  Problem Relation Age of Onset  . Arthritis Mother   . Drug abuse Father   . Alcohol abuse Father   . Cancer Neg Hx     Social History Social History   Tobacco Use  . Smoking status: Never Smoker  . Smokeless tobacco: Never Used  Substance Use Topics  . Alcohol use: Yes  . Drug use: No     Allergies   Latex   Review of Systems Review of Systems  Reason unable to perform ROS: See HPI as above.     Physical Exam Triage Vital Signs ED Triage Vitals  Enc Vitals Group   BP 03/26/19 0831 133/82     Pulse Rate 03/26/19 0831 73     Resp 03/26/19 0831 16     Temp 03/26/19 0831 98.2 F (36.8 C)     Temp Source 03/26/19 0831 Oral     SpO2 03/26/19 0831 99 %     Weight --      Height --      Head Circumference --      Peak Flow --      Pain Score 03/26/19 0834 7     Pain Loc --      Pain Edu? --      Excl. in [place]? --    No data found.  Updated Vital Signs BP 133/82 (BP Location: Left Arm)   Pulse 73   Temp 98.2 F (36.8 C) (Oral)   Resp 16   SpO2 99%   Physical Exam Constitutional:      General: He is not in acute distress.    Appearance: Normal appearance. He is not ill-appearing, toxic-appearing or diaphoretic.  HENT:     Head: Normocephalic and atraumatic.     Mouth/Throat:     Mouth: Mucous membranes are moist.     Pharynx: Oropharynx is  clear. Uvula midline.  Neck:     Musculoskeletal: Normal range of motion and neck supple.     Comments: No tenderness to palpation of spinous processes.  Tenderness to palpation of left neck, right trapezius muscle region as well as left paraspinal muscles.  Full range of motion of neck.  Strength normal equal bilaterally. Cardiovascular:     Rate and Rhythm: Normal rate and regular rhythm.     Heart sounds: Normal heart sounds. No murmur. No friction rub. No gallop.   Pulmonary:     Effort: Pulmonary effort is normal. No accessory muscle usage, prolonged expiration, respiratory distress or retractions.     Comments: Lungs clear to auscultation without adventitious lung sounds. Musculoskeletal:     Comments: No tenderness to palpation of spinous processes.  Tenderness to palpation of left mid trapezius area.  No bony tenderness to palpation of the shoulder.  Full range of motion of shoulder, back, elbow.  Strength normal and equal bilaterally.  Sensation intact ankle bilaterally.  Radial pulse 2+, cap refill less than 2 seconds.  Neurological:     General: No focal deficit present.     Mental Status: He  is alert and oriented to person, place, and time.      UC Treatments / Results  Labs (all labs ordered are listed, but only abnormal results are displayed) Labs Reviewed - No data to display  EKG   Radiology No results found.  Procedures Procedures (including critical care time)  Medications Ordered in UC Medications  methylPREDNISolone sodium succinate (SOLU-MEDROL) 125 mg/2 mL injection 80 mg (80 mg Intramuscular Given 03/26/19 0901)    Initial Impression / Assessment and Plan / UC Course  I have reviewed the triage vital signs and the nursing notes.  Pertinent labs & imaging results that were available during my care of the patient were reviewed by me and considered in my medical decision making (see chart for details).    Patient states in the past, cortisone injections can help resolve symptoms.  Will provide Solu-Medrol injection in office today.  Given no improvement with Flexeril, can try tizanidine for symptomatic relief.  Can continue NSAIDs at this time.  Return precautions given.  Patient expresses understanding and agrees to plan.  Final Clinical Impressions(s) / UC Diagnoses   Final diagnoses:  Neck pain on left side   ED Prescriptions    Medication Sig Dispense Auth. Provider   tiZANidine (ZANAFLEX) 4 MG tablet Take 1 tablet (4 mg total) by mouth every 6 (six) hours as needed for muscle spasms. 15 tablet Ok Edwards, PA-C     PDMP not reviewed this encounter.   Ok Edwards, PA-C 03/26/19 1207

## 2019-03-26 NOTE — ED Triage Notes (Signed)
Pt c/o lt neck pain radiating to lt shoulder since he woke up yesterday morning. States hx of pinch nerve in neck. Denies injury .

## 2019-03-26 NOTE — Discharge Instructions (Signed)
Solu-Medrol injection in office today.  Continue ibuprofen 803 times a day as needed.  You can try tizanidine for further symptomatic relief.  If taking tizanidine, please avoid taking Flexeril.  Follow-up with PCP for further evaluation if symptoms not improving.  If experiencing loss of grip strength, go to the ED for further evaluation needed.

## 2019-04-29 MED FILL — ALBUTEROL SULFATE HFA 108 (: 108 (90 BAS | 17 days supply | Qty: 18 | Fill #0

## 2019-04-29 MED FILL — BENZONATATE 200 MG CAP: 200 | 10 days supply | Qty: 30 | Fill #0

## 2019-04-29 MED FILL — ONDANSETRON ODT 4 MG TABLET: 4 | 10 days supply | Qty: 40 | Fill #0

## 2019-05-21 IMAGING — DX DG HAND COMPLETE 3+V*R*
3 series · 3 of 3 positions shown · non-contrast
Comparison: None

CLINICAL DATA: RIGHT hand injured when drill kicked backwards 2
hours ago

EXAM:
RIGHT HAND - COMPLETE 3+ VIEW

[hand pa]
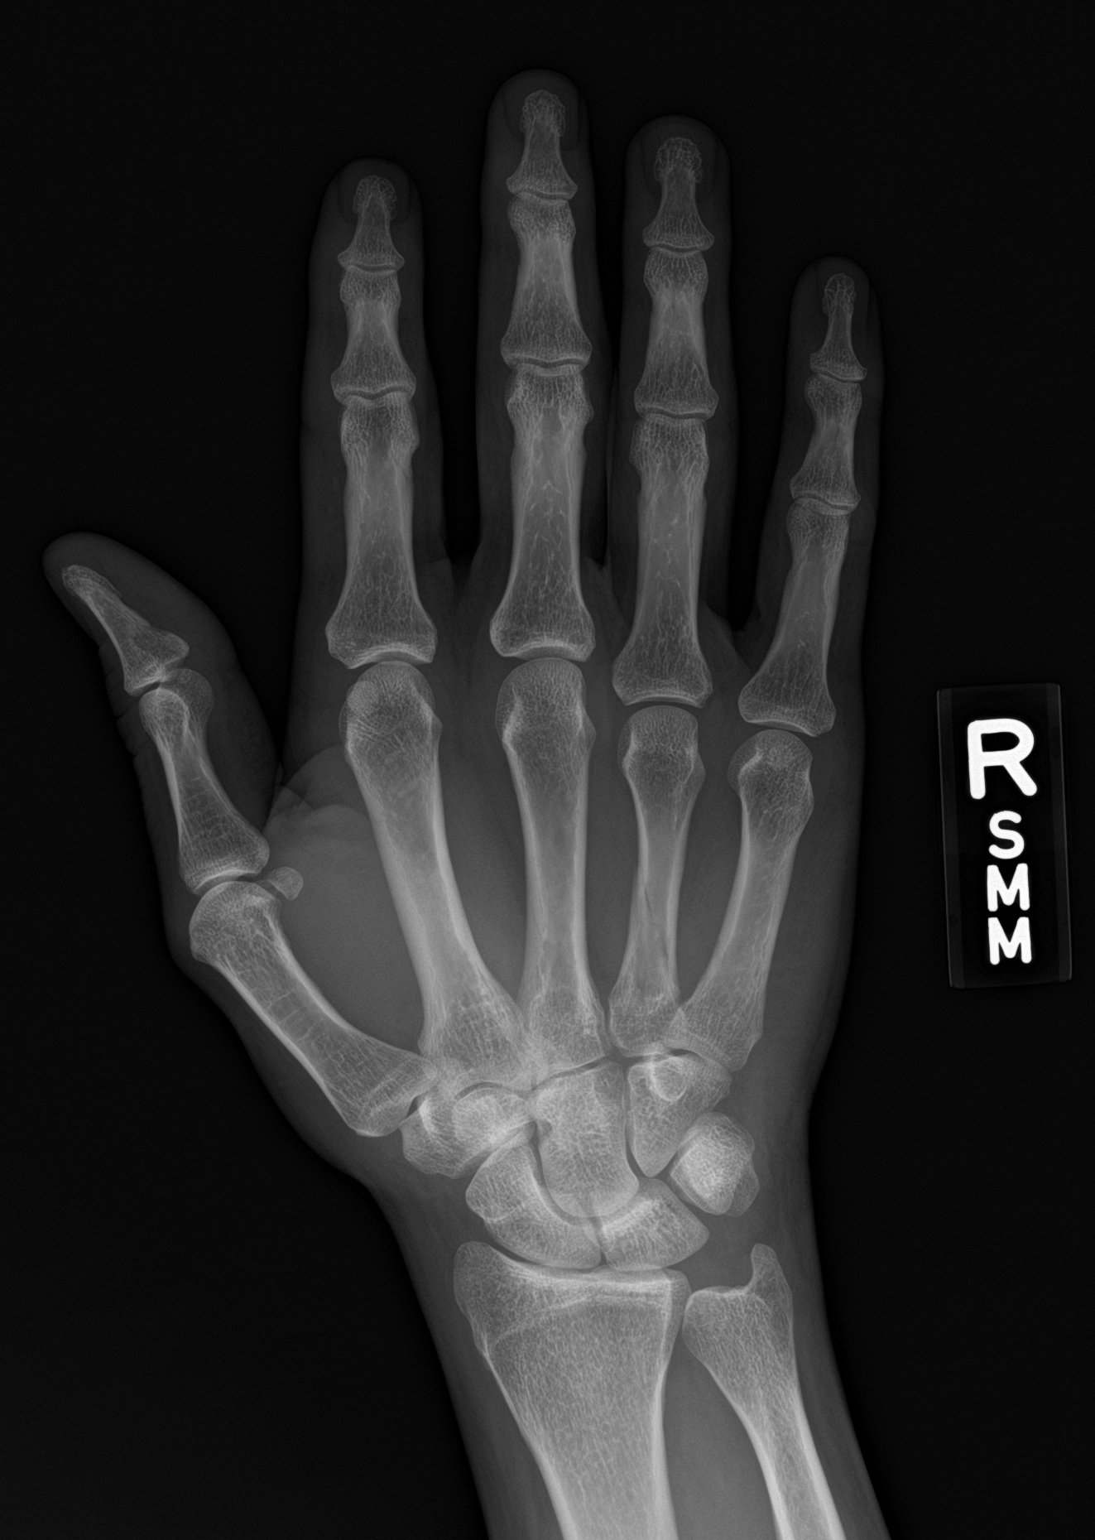

[hand obl]
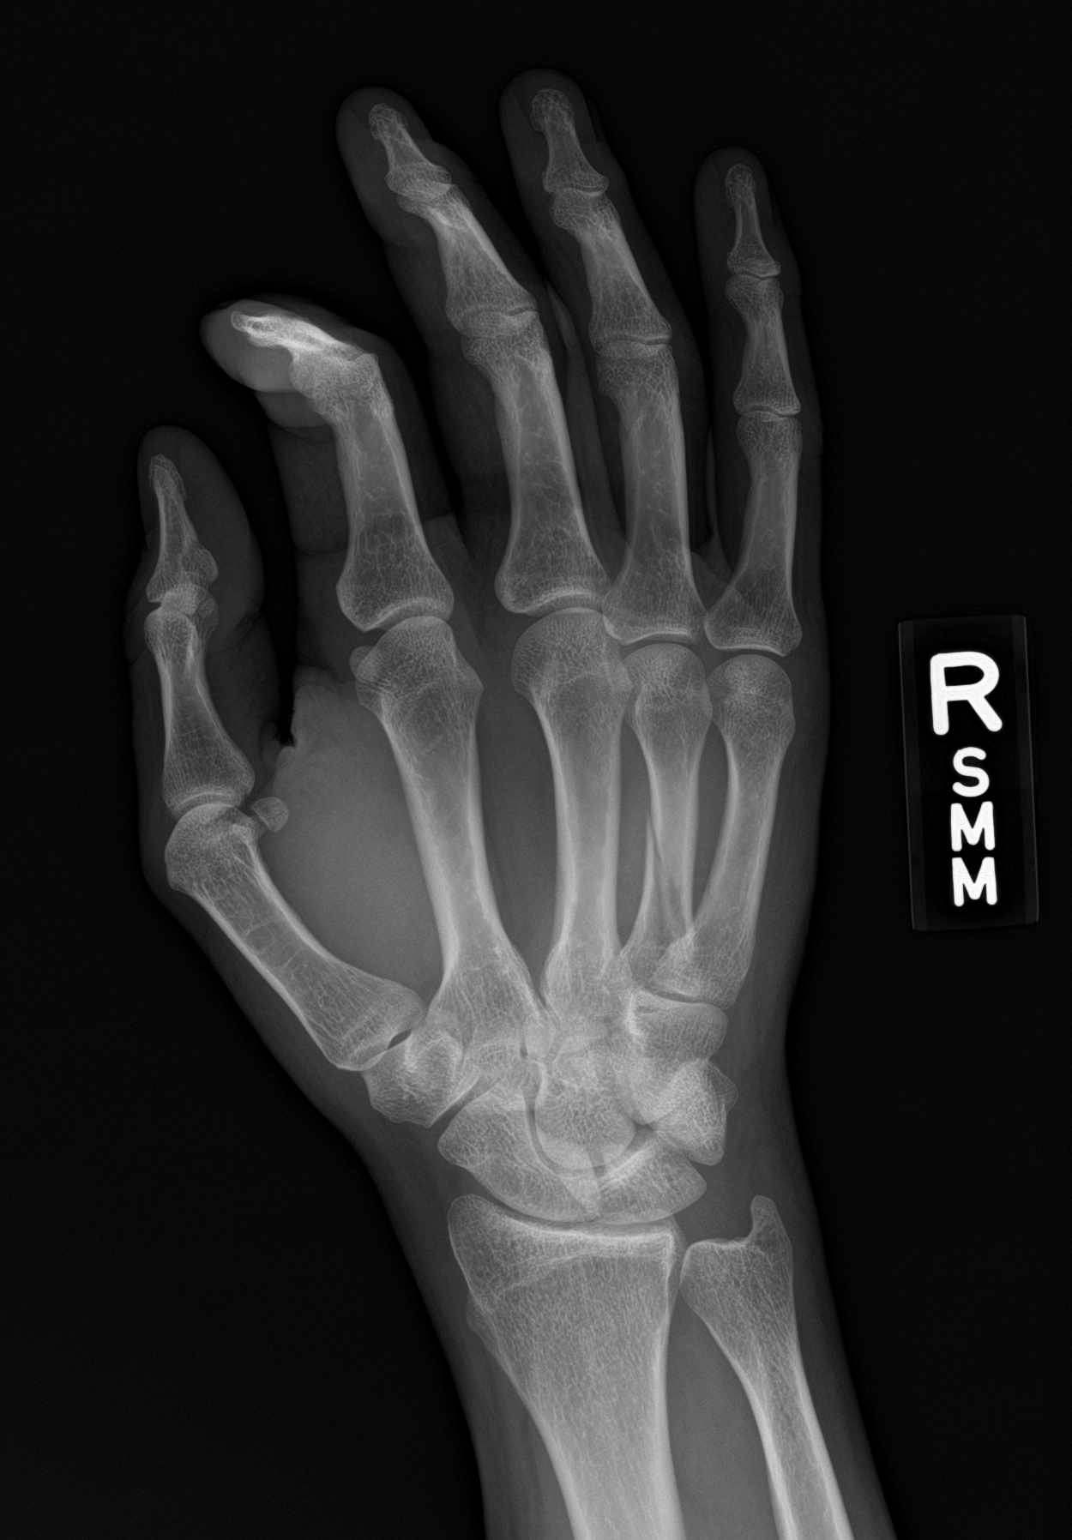

[hand lat]
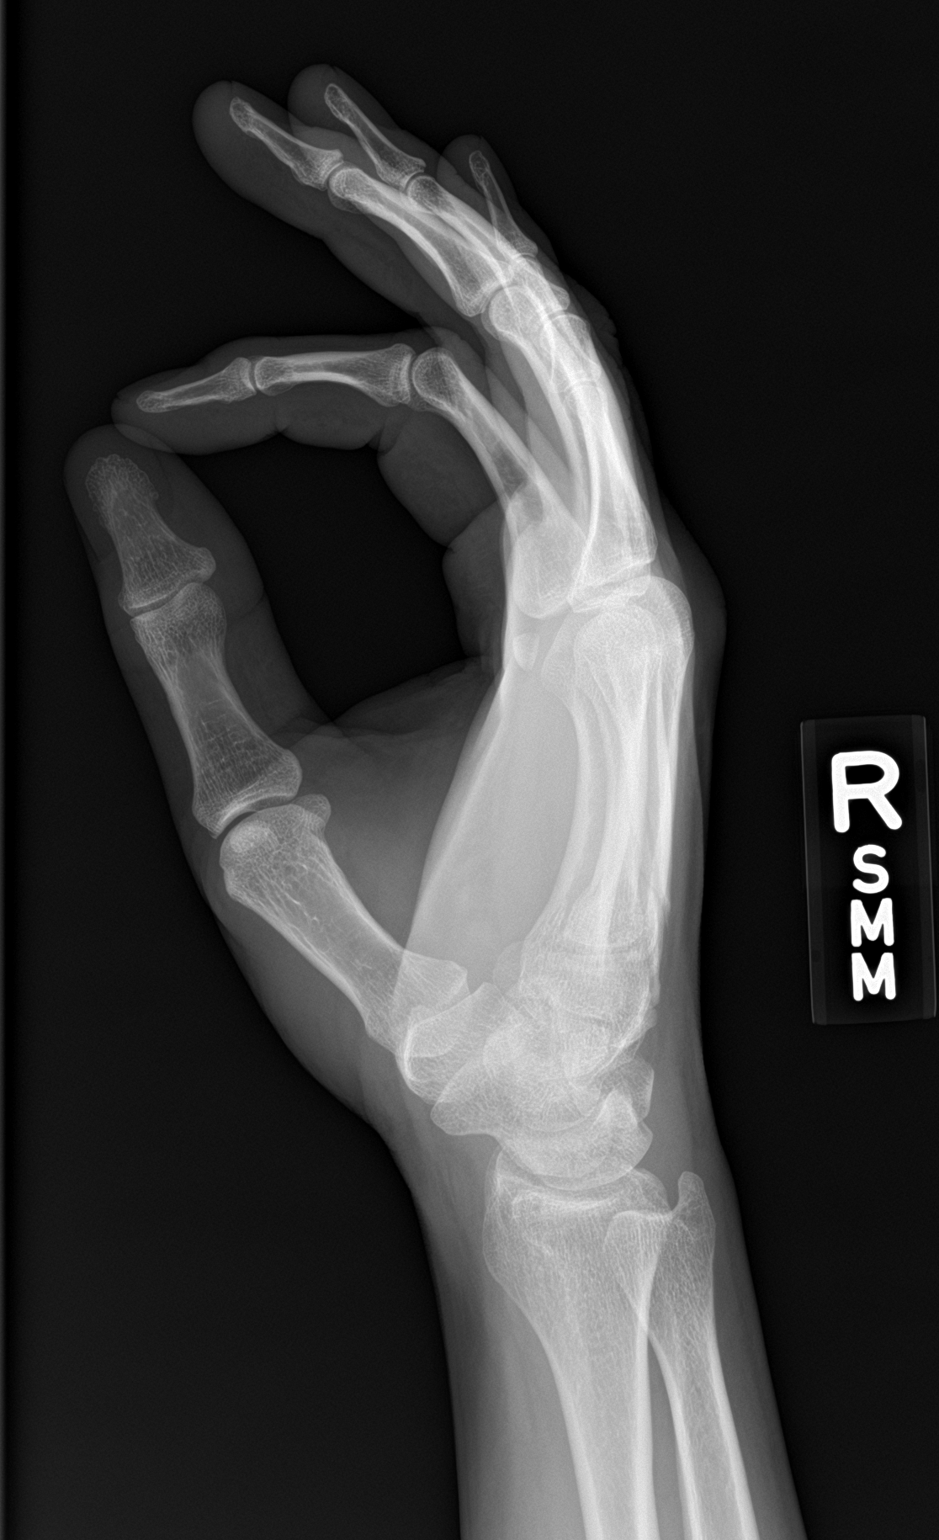

[3 of 3 positions shown; findings below may reference images not displayed]

FINDINGS: Osseous mineralization normal.

Joint spaces preserved.

Oblique essentially nondisplaced fracture at proximal shaft of
fourth metacarpal.

No additional fracture, dislocation, or bone destruction.
IMPRESSION: Oblique essentially nondisplaced oblique fracture of the fourth
metacarpal diaphysis proximally.

## 2019-08-17 ENCOUNTER — Encounter: Payer: Self-pay | Admitting: Family Medicine

## 2019-08-17 ENCOUNTER — Ambulatory Visit (INDEPENDENT_AMBULATORY_CARE_PROVIDER_SITE_OTHER): Payer: No Typology Code available for payment source | Admitting: Family Medicine

## 2019-08-17 ENCOUNTER — Other Ambulatory Visit: Payer: Self-pay

## 2019-08-17 VITALS — BP 124/82 | HR 69 | Temp 97.2°F | Ht 74.0 in | Wt 197.1 lb

## 2019-08-17 DIAGNOSIS — Z0000 Encounter for general adult medical examination without abnormal findings: Secondary | ICD-10-CM | POA: Diagnosis not present

## 2019-08-17 NOTE — Progress Notes (Signed)
Chief Complaint  Patient presents with  . Annual Exam    Well Male Samuel Spencer is here for a complete physical.   His last physical was >1 year ago.  Current diet: in general, a "healthy" diet.   Current exercise: walking Weight trend: stable Fatigue? No. Seat belt? Yes.    Health maintenance Tetanus- Yes HIV- Yes  Past Medical History:  Diagnosis Date  . Allergy   . Asthma    Allergy induced     Past Surgical History:  Procedure Laterality Date  . EYE SURGERY      Medications  Takes no meds routinely.    Allergies Allergies  Allergen Reactions  . Latex     Family History Family History  Problem Relation Age of Onset  . Arthritis Mother   . Drug abuse Father   . Alcohol abuse Father   . Cancer Neg Hx     Review of Systems: Constitutional: no fevers or chills Eye:  no recent significant change in vision Ear/Nose/Mouth/Throat:  Ears:  no hearing loss Nose/Mouth/Throat:  no complaints of nasal congestion, no sore throat Cardiovascular:  no chest pain Respiratory:  no shortness of breath Gastrointestinal:  no abdominal pain, no change in bowel habits GU:  Male: negative for dysuria, frequency, and incontinence Musculoskeletal/Extremities:  no pain of the joints Integumentary (Skin/Breast):  no abnormal skin lesions reported Neurologic:  no headaches Endocrine: No unexpected weight changes Hematologic/Lymphatic:  no night sweats  Exam BP 124/82 (BP Location: Left Arm, Patient Position: Sitting, Cuff Size: Normal)   Pulse 69   Temp (!) 97.2 F (36.2 C) (Temporal)   Ht 6\' 2"  (1.88 m)   Wt 197 lb 2 oz (89.4 kg)   SpO2 98%   BMI 25.31 kg/m  General:  well developed, well nourished, in no apparent distress Skin:  no significant moles, warts, or growths Head:  no masses, lesions, or tenderness Eyes:  pupils equal and round, sclera anicteric without injection Ears:  canals without lesions, TMs shiny without retraction, no obvious effusion,  no erythema Nose:  nares patent, septum midline, mucosa normal Throat/Pharynx:  lips and gingiva without lesion; tongue and uvula midline; non-inflamed pharynx; no exudates or postnasal drainage Neck: neck supple without adenopathy, thyromegaly, or masses Lungs:  clear to auscultation, breath sounds equal bilaterally, no respiratory distress Cardio:  regular rate and rhythm, no bruits, no LE edema Abdomen:  abdomen soft, nontender; bowel sounds normal; no masses or organomegaly Rectal: Deferred Musculoskeletal:  symmetrical muscle groups noted without atrophy or deformity Extremities:  no clubbing, cyanosis, or edema, no deformities, no skin discoloration Neuro:  gait normal; deep tendon reflexes normal and symmetric Psych: well oriented with normal range of affect and appropriate judgment/insight  Assessment and Plan  Well adult exam - Plan: CBC, Comprehensive metabolic panel, Lipid panel   Well 34 y.o. male. Counseled on diet and exercise. Self testicular exams recommended at least monthly.  Other orders as above. Follow up in 1 year pending the above workup. The patient voiced understanding and agreement to the plan.  31 Kitzmiller, DO 08/17/19 2:17 PM

## 2019-08-17 NOTE — Patient Instructions (Signed)
Give us 2-3 business days to get the results of your labs back.   Keep the diet clean and stay active.  Do monthly self testicular checks in the shower. You are feeling for lumps/bumps that don't belong. If you feel anything like this, let me know!  Let us know if you need anything. 

## 2019-09-01 ENCOUNTER — Other Ambulatory Visit: Payer: Self-pay

## 2019-09-01 ENCOUNTER — Other Ambulatory Visit (INDEPENDENT_AMBULATORY_CARE_PROVIDER_SITE_OTHER): Payer: No Typology Code available for payment source

## 2019-09-01 DIAGNOSIS — Z0000 Encounter for general adult medical examination without abnormal findings: Secondary | ICD-10-CM | POA: Diagnosis not present

## 2019-09-01 LAB — COMPREHENSIVE METABOLIC PANEL
ALT: 15 U/L (ref 0–53)
AST: 14 U/L (ref 0–37)
Albumin: 4.7 g/dL (ref 3.5–5.2)
Alkaline Phosphatase: 65 U/L (ref 39–117)
BUN: 11 mg/dL (ref 6–23)
CO2: 29 mEq/L (ref 19–32)
Calcium: 9.3 mg/dL (ref 8.4–10.5)
Chloride: 100 mEq/L (ref 96–112)
Creatinine, Ser: 0.77 mg/dL (ref 0.40–1.50)
GFR: 114.8 mL/min (ref >60.00)
Glucose, Bld: 86 mg/dL (ref 70–99)
Potassium: 4 mEq/L (ref 3.5–5.1)
Sodium: 136 mEq/L (ref 135–145)
Total Bilirubin: 0.9 mg/dL (ref 0.2–1.2)
Total Protein: 7.4 g/dL (ref 6.0–8.3)

## 2019-09-01 LAB — LIPID PANEL
Cholesterol: 146 mg/dL (ref 0–200)
HDL: 43.4 mg/dL (ref >39.00)
LDL Cholesterol: 90 mg/dL (ref 0–99)
NonHDL: 102.13
Total CHOL/HDL Ratio: 3
Triglycerides: 59 mg/dL (ref 0.0–149.0)
VLDL: 11.8 mg/dL (ref 0.0–40.0)

## 2019-09-01 LAB — CBC
HCT: 43.1 % (ref 39.0–52.0)
Hemoglobin: 14.8 g/dL (ref 13.0–17.0)
MCHC: 34.3 g/dL (ref 30.0–36.0)
MCV: 91.7 fl (ref 78.0–100.0)
Platelets: 244 10*3/uL (ref 150.0–400.0)
RBC: 4.71 Mil/uL (ref 4.22–5.81)
RDW: 12.6 % (ref 11.5–15.5)
WBC: 6.6 10*3/uL (ref 4.0–10.5)

## 2019-10-09 ENCOUNTER — Ambulatory Visit
Admission: EM | Admit: 2019-10-09 | Discharge: 2019-10-09 | Disposition: A | Discharge status: Home / Self Care | Payer: No Typology Code available for payment source | Attending: Emergency Medicine | Admitting: Emergency Medicine

## 2019-10-09 DIAGNOSIS — S39012A Strain of muscle, fascia and tendon of lower back, initial encounter: Secondary | ICD-10-CM | POA: Diagnosis not present

## 2019-10-09 MED ORDER — IBUPROFEN 800 MG PO TABS: 800.0000 mg | ORAL_TABLET | Freq: Three times a day (TID) | ORAL | 0 refills | Status: AC

## 2019-10-09 MED ORDER — CYCLOBENZAPRINE HCL 5 MG PO TABS: 5.0000 mg | ORAL_TABLET | Freq: Two times a day (BID) | ORAL | 0 refills | Status: AC | PRN

## 2019-10-09 MED FILL — IBUPROFEN 800 MG TAB: 800 | 7 days supply | Qty: 21 | Fill #0

## 2019-10-09 MED FILL — CYCLOBENZAPRINE HCL 5 MG TA: 5 | 5 days supply | Qty: 10 | Fill #0

## 2019-10-09 NOTE — ED Provider Notes (Signed)
[facility]    CSN: [id number] Arrival date & time: 10/09/19  1019      History   Chief Complaint Chief Complaint  Patient presents with  . Back Pain    HPI Samuel Spencer is a 34 y.o. male with history of allergies, asthma presents for right-sided back pain.  States pain radiates down right leg, worse with certain movements.  Has tried ibuprofen and Flexeril with moderate relief.  Denies trauma/injury to the affected area.  States it occurred after picking up his 18-year-old daughter yesterday.  Denies fever, saddle area anesthesia, lower extremity numbness/weakness, urinary retention, fecal incontinence.  Past Medical History:  Diagnosis Date  . Allergy   . Asthma    Allergy induced    Patient Active Problem List   Diagnosis Date Noted  . Well adult exam 10/01/2017    Past Surgical History:  Procedure Laterality Date  . EYE SURGERY         Home Medications    Prior to Admission medications   Medication Sig Start Date End Date Taking? Authorizing Provider  cyclobenzaprine (FLEXERIL) 5 MG tablet Take 1 tablet (5 mg total) by mouth 2 (two) times daily as needed for up to 5 days for muscle spasms. 10/09/19 10/14/19  Hall-Potvin, [place], PA-C  ibuprofen (ADVIL) 800 MG tablet Take 1 tablet (800 mg total) by mouth 3 (three) times daily. 10/09/19   Hall-Potvin, [place], PA-C  Multiple Vitamin (MULTIVITAMIN) tablet Take 1 tablet by mouth daily.    [provider]    Family History Family History  Problem Relation Age of Onset  . Arthritis Mother   . Drug abuse Father   . Alcohol abuse Father   . Cancer Neg Hx     Social History Social History   Tobacco Use  . Smoking status: Never Smoker  . Smokeless tobacco: Never Used  Substance Use Topics  . Alcohol use: Yes  . Drug use: No     Allergies   Latex   Review of Systems As per HPI   Physical Exam Triage Vital Signs ED Triage Vitals  Enc Vitals Group     BP    Pulse      Resp      Temp      Temp src      SpO2      Weight      Height      Head Circumference      Peak Flow      Pain Score      Pain Loc      Pain Edu?      Excl. in [place]?    No data found.  Updated Vital Signs BP 125/86 (BP Location: Left Arm)   Pulse 75   Temp 98 F (36.7 C) (Oral)   Resp 18   SpO2 98%   Visual Acuity Right Eye Distance:   Left Eye Distance:   Bilateral Distance:    Right Eye Near:   Left Eye Near:    Bilateral Near:     Physical Exam Constitutional:      General: He is not in acute distress. HENT:     Head: Normocephalic and atraumatic.  Eyes:     General: No scleral icterus.    Pupils: Pupils are equal, round, and reactive to light.  Cardiovascular:     Rate and Rhythm: Normal rate.  Pulmonary:     Effort: Pulmonary effort is normal. No respiratory distress.  Breath sounds: No wheezing.  Musculoskeletal:       Back:     Right hip: Normal.     Left hip: Normal.     Right knee: Normal.     Left knee: Normal.     Right ankle: Normal.     Left ankle: Normal.  Skin:    Coloration: Skin is not jaundiced or pale.  Neurological:     Mental Status: He is alert and oriented to person, place, and time.     Sensory: No sensory deficit.     Gait: Gait normal.     Deep Tendon Reflexes: Reflexes normal.      UC Treatments / Results  Labs (all labs ordered are listed, but only abnormal results are displayed) Labs Reviewed - No data to display  EKG   Radiology No results found.  Procedures Procedures (including critical care time)  Medications Ordered in UC Medications - No data to display  Initial Impression / Assessment and Plan / UC Course  I have reviewed the triage vital signs and the nursing notes.  Pertinent labs & imaging results that were available during my care of the patient were reviewed by me and considered in my medical decision making (see chart for details).     Patient afebrile, nontoxic, and  without neurocognitive deficit.  H&P consistent with right thoracic back strain.  We will continue current regimen.  Return precautions discussed, patient verbalized understanding and is agreeable to plan. Final Clinical Impressions(s) / UC Diagnoses   Final diagnoses:  Back strain, initial encounter     Discharge Instructions     Recommend RICE: rest, ice, compression, elevation as needed for pain.    Heat therapy (hot compress, warm wash rag, hot showers, etc.) can help relax muscles and soothe muscle aches. Cold therapy (ice packs) can be used to help swelling both after injury and after prolonged use of areas of chronic pain/aches.  For pain: recommend 350 mg-1000 mg of Tylenol (acetaminophen) and/or 200 mg - 800 mg of Advil (ibuprofen, Motrin) every 8 hours as needed.  May alternate between the two throughout the day as they are generally safe to take together.  DO NOT exceed more than 3000 mg of Tylenol or 3200 mg of ibuprofen in a 24 hour period as this could damage your stomach, kidneys, liver, or increase your bleeding risk.  May take muscle relaxer as needed for severe pain / spasm.  (This medication may cause you to become tired so it is important you do not drink alcohol or operate heavy machinery while on this medication.  Recommend your first dose to be taken before bedtime to monitor for side effects safely)  Return for worsening pain, lower extremity numbness or weakness, urinary retention, fecal incontinence, fever.    ED Prescriptions    Medication Sig Dispense Auth. Provider   ibuprofen (ADVIL) 800 MG tablet Take 1 tablet (800 mg total) by mouth 3 (three) times daily. 21 tablet Hall-Potvin, [place], PA-C   cyclobenzaprine (FLEXERIL) 5 MG tablet Take 1 tablet (5 mg total) by mouth 2 (two) times daily as needed for up to 5 days for muscle spasms. 10 tablet Hall-Potvin, [place], PA-C     I have reviewed the PDMP during this encounter.   Hall-Potvin, [place],  [place] 10/09/19 1053

## 2019-10-09 NOTE — Discharge Instructions (Signed)
Recommend RICE: rest, ice, compression, elevation as needed for pain.    Heat therapy (hot compress, warm wash rag, hot showers, etc.) can help relax muscles and soothe muscle aches. Cold therapy (ice packs) can be used to help swelling both after injury and after prolonged use of areas of chronic pain/aches.  For pain: recommend 350 mg-1000 mg of Tylenol (acetaminophen) and/or 200 mg - 800 mg of Advil (ibuprofen, Motrin) every 8 hours as needed.  May alternate between the two throughout the day as they are generally safe to take together.  DO NOT exceed more than 3000 mg of Tylenol or 3200 mg of ibuprofen in a 24 hour period as this could damage your stomach, kidneys, liver, or increase your bleeding risk.  May take muscle relaxer as needed for severe pain / spasm.  (This medication may cause you to become tired so it is important you do not drink alcohol or operate heavy machinery while on this medication.  Recommend your first dose to be taken before bedtime to monitor for side effects safely)  Return for worsening pain, lower extremity numbness or weakness, urinary retention, fecal incontinence, fever.

## 2019-10-09 NOTE — ED Triage Notes (Signed)
Pt c/o mid to center back pain radiating down rt buttocks since yesterday after picking up his 33yr old daughter that weights 40-50lbs. States took a flexeril last night, nothing today.

## 2019-11-17 ENCOUNTER — Telehealth: Payer: Self-pay | Admitting: Family Medicine

## 2019-11-17 NOTE — Telephone Encounter (Signed)
Patient has a new job and needs an order for a PPD test. He would like to come in tomorrow for the test. Please advise

## 2019-11-17 NOTE — Telephone Encounter (Signed)
Last OV 08/17/2019

## 2019-11-17 NOTE — Telephone Encounter (Signed)
PCP said ok to schedule NV for this just not on a Thursday-thanks

## 2019-11-17 NOTE — Telephone Encounter (Signed)
Patient is scheduled for Wednesday  11/18/19. He also mentioned that he will need his wellness form for his physical signed by Carmelia Roller.

## 2019-11-17 NOTE — Telephone Encounter (Signed)
OK, make sure it is not Thursday though. Ty.

## 2019-11-17 NOTE — Telephone Encounter (Signed)
Ok no problem will take care of .

## 2019-11-18 ENCOUNTER — Other Ambulatory Visit: Payer: Self-pay

## 2019-11-18 ENCOUNTER — Ambulatory Visit (INDEPENDENT_AMBULATORY_CARE_PROVIDER_SITE_OTHER): Payer: No Typology Code available for payment source | Admitting: *Deleted

## 2019-11-18 ENCOUNTER — Telehealth: Payer: Self-pay | Admitting: Family Medicine

## 2019-11-18 DIAGNOSIS — Z111 Encounter for screening for respiratory tuberculosis: Secondary | ICD-10-CM | POA: Diagnosis not present

## 2019-11-18 NOTE — Progress Notes (Signed)
Patient here for PPD per Dr. Carmelia Roller.  PPD placed on left arm and patient tolerated well.  Patient scheduled to come back on 11/20/19 for read.

## 2019-11-18 NOTE — Telephone Encounter (Signed)
Pt dropped off document to be filled out by provider (1 page RN network physical exam paper) Pt would like to be called when document ready at [phone]. Document put at front office tray under providers name.

## 2019-11-18 NOTE — Telephone Encounter (Signed)
PCP completed/called the patient informed ready for pickup at the front desk.

## 2019-11-20 ENCOUNTER — Ambulatory Visit: Payer: No Typology Code available for payment source

## 2019-11-20 ENCOUNTER — Other Ambulatory Visit: Payer: Self-pay

## 2019-11-20 LAB — TB SKIN TEST
Induration: 0 mm
TB Skin Test: NEGATIVE
